# Patient Record
Sex: Male | Born: 1960
Health system: Southern US, Community
[De-identification: ages and names within clinical notes are randomized; demographics above are authoritative.]

## PROBLEM LIST (undated history)

## (undated) DIAGNOSIS — I10 Essential (primary) hypertension: Secondary | ICD-10-CM

## (undated) DIAGNOSIS — B159 Hepatitis A without hepatic coma: Secondary | ICD-10-CM

## (undated) DIAGNOSIS — T7840XA Allergy, unspecified, initial encounter: Secondary | ICD-10-CM

## (undated) DIAGNOSIS — E785 Hyperlipidemia, unspecified: Secondary | ICD-10-CM

## (undated) HISTORY — DX: Essential (primary) hypertension: I10

## (undated) HISTORY — DX: Allergy, unspecified, initial encounter: T78.40XA

## (undated) HISTORY — DX: Hepatitis a without hepatic coma: B15.9

## (undated) HISTORY — DX: Hyperlipidemia, unspecified: E78.5

---

## 2005-08-01 ENCOUNTER — Emergency Department (HOSPITAL_COMMUNITY): Admission: EM | Admit: 2005-08-01 | Discharge: 2005-08-02 | Payer: Self-pay | Admitting: Emergency Medicine

## 2011-06-05 ENCOUNTER — Encounter: Payer: Self-pay | Admitting: Gastroenterology

## 2011-06-19 ENCOUNTER — Ambulatory Visit (AMBULATORY_SURGERY_CENTER): Payer: BC Managed Care – PPO | Admitting: *Deleted

## 2011-06-19 ENCOUNTER — Encounter: Payer: Self-pay | Admitting: Gastroenterology

## 2011-06-19 VITALS — Ht 69.0 in | Wt 225.0 lb

## 2011-06-19 DIAGNOSIS — Z1211 Encounter for screening for malignant neoplasm of colon: Secondary | ICD-10-CM

## 2011-06-19 MED ORDER — PEG-KCL-NACL-NASULF-NA ASC-C 100 G PO SOLR: ORAL | Status: DC

## 2011-07-03 ENCOUNTER — Encounter: Payer: Self-pay | Admitting: Gastroenterology

## 2011-07-03 ENCOUNTER — Ambulatory Visit (AMBULATORY_SURGERY_CENTER): Payer: BC Managed Care – PPO | Admitting: Gastroenterology

## 2011-07-03 VITALS — BP 132/106 | HR 84 | Temp 98.2°F | Resp 16 | Ht 69.0 in | Wt 225.0 lb

## 2011-07-03 DIAGNOSIS — Z1211 Encounter for screening for malignant neoplasm of colon: Secondary | ICD-10-CM

## 2011-07-03 MED ORDER — SODIUM CHLORIDE 0.9 % IV SOLN: 500.0000 mL | INTRAVENOUS | Status: DC

## 2011-07-03 NOTE — Patient Instructions (Addendum)
YOU HAD AN ENDOSCOPIC PROCEDURE TODAY AT THE [facility]: Refer to the procedure report that was given to you for any specific questions about what was found during the examination.  If the procedure report does not answer your questions, please call your gastroenterologist to clarify.  If you requested that your care partner not be given the details of your procedure findings, then the procedure report has been included in a sealed envelope for you to review at your convenience later.  YOU SHOULD EXPECT: Some feelings of bloating in the abdomen. Passage of more gas than usual.  Walking can help get rid of the air that was put into your GI tract during the procedure and reduce the bloating. If you had a lower endoscopy (such as a colonoscopy or flexible sigmoidoscopy) you may notice spotting of blood in your stool or on the toilet paper. If you underwent a bowel prep for your procedure, then you may not have a normal bowel movement for a few days.  DIET: Your first meal following the procedure should be a light meal and then it is ok to progress to your normal diet.  A half-sandwich or bowl of soup is an example of a good first meal.  Heavy or fried foods are harder to digest and may make you feel nauseous or bloated.  Likewise meals heavy in dairy and vegetables can cause extra gas to form and this can also increase the bloating.  Drink plenty of fluids but you should avoid alcoholic beverages for 24 hours.  ACTIVITY: Your care partner should take you home directly after the procedure.  You should plan to take it easy, moving slowly for the rest of the day.  You can resume normal activity the day after the procedure however you should NOT DRIVE or use heavy machinery for 24 hours (because of the sedation medicines used during the test).    SYMPTOMS TO REPORT IMMEDIATELY: A gastroenterologist can be reached at any hour.  During normal business hours, 8:30 AM to 5:00 PM Monday through Friday,  call [phone].  After hours and on weekends, please call the GI answering service at [phone] who will take a message and have the physician on call contact you.   Following lower endoscopy (colonoscopy or flexible sigmoidoscopy):  Excessive amounts of blood in the stool  Significant tenderness or worsening of abdominal pains  Swelling of the abdomen that is new, acute  Fever of 100F or higher  FOLLOW UP:  Our staff will call the home number listed on your records the next business day following your procedure to check on you and address any questions or concerns that you may have at that time regarding the information given to you following your procedure. This is a courtesy call and so if there is no answer at the home number and we have not heard from you through the emergency physician on call, we will assume that you have returned to your regular daily activities without incident.  SIGNATURES/CONFIDENTIALITY: You and/or your care partner have signed paperwork which will be entered into your electronic medical record.  These signatures attest to the fact that that the information above on your After Visit Summary has been reviewed and is understood.  Full responsibility of the confidentiality of this discharge information lies with you and/or your care-partner.   Dr. Norval Gable office nurse will call you to set up an appointment for a sleep study

## 2011-07-03 NOTE — Progress Notes (Signed)
Patient did not experience any of the following events: a burn prior to discharge; a fall within the facility; wrong site/side/patient/procedure/implant event; or a hospital transfer or hospital admission upon discharge from the facility. (G8907) Patient did not have preoperative order for IV antibiotic SSI prophylaxis. (G8918)  

## 2011-07-03 NOTE — Op Note (Addendum)
[facility] [address] [place], [place]  [address]  COLONOSCOPY PROCEDURE REPORT  PATIENT:  Joel Powell, Joel Powell  MR#:  [id number] BIRTHDATE:  1960/02/06, 51 yrs. old  GENDER:  male ENDOSCOPIST:  Vania Rea. Jarold Motto, MD, [facility] REF. BY: PROCEDURE DATE:  07/03/2011 PROCEDURE:  Average-risk screening colonoscopy G0121 ASA CLASS:  Class II INDICATIONS:  Routine Risk Screening MEDICATIONS:   propofol (Diprivan) 200 mg IV  DESCRIPTION OF PROCEDURE:   After the risks and benefits and of the procedure were explained, informed consent was obtained. Digital rectal exam was performed and revealed no abnormalities. The LB [id number] endoscope was introduced through the anus and advanced to the cecum, which was identified by both the appendix and ileocecal valve.  The quality of the prep was excellent, using MoviPrep.  The instrument was then slowly withdrawn as the colon was fully examined. <<PROCEDUREIMAGES>>  FINDINGS:  No polyps or cancers were seen.  This was otherwise a normal examination of the colon.   Retroflexed views in the rectum revealed no abnormalities.    The scope was then withdrawn from the patient and the procedure completed.  COMPLICATIONS:  None ENDOSCOPIC IMPRESSION: 1) No polyps or cancers 2) Otherwise normal examination RECOMMENDATIONS: 1) Continue current colorectal screening recommendations for "routine risk" patients with a repeat colonoscopy in 10 years. REFERRAL FOR SLEEP APNEA EVALUATION PER UPPER AIRWAY OBSTRUCTION.  REPEAT EXAM:  No  ______________________________ Vania Rea. Jarold Motto, MD, [facility]  CC:  n. REVISED:  07/03/2011 03:06 PM eSIGNED:   Vania Rea. Molly Savarino at 07/03/2011 03:06 PM  Zetta Bills, [phone]

## 2011-07-04 ENCOUNTER — Telehealth: Payer: Self-pay | Admitting: *Deleted

## 2011-07-04 DIAGNOSIS — J988 Other specified respiratory disorders: Secondary | ICD-10-CM

## 2011-07-04 NOTE — Telephone Encounter (Signed)
lmom for pt to call back. Pt has an appt with Dr Craige Cotta on 08/09/2011 at 2:15pm, arrive at 2pm.

## 2011-07-04 NOTE — Telephone Encounter (Signed)
Informed pt of his appt with Dr Craige Cotta; pt stated understanding.

## 2011-07-04 NOTE — Telephone Encounter (Signed)
Message left for the patient at number provided in admitting. 

## 2011-08-09 ENCOUNTER — Institutional Professional Consult (permissible substitution): Payer: BC Managed Care – PPO | Admitting: Pulmonary Disease

## 2015-04-22 DIAGNOSIS — I10 Essential (primary) hypertension: Secondary | ICD-10-CM | POA: Diagnosis not present

## 2015-04-22 DIAGNOSIS — E789 Disorder of lipoprotein metabolism, unspecified: Secondary | ICD-10-CM | POA: Diagnosis not present

## 2015-07-29 DIAGNOSIS — I10 Essential (primary) hypertension: Secondary | ICD-10-CM | POA: Diagnosis not present

## 2015-07-29 DIAGNOSIS — E785 Hyperlipidemia, unspecified: Secondary | ICD-10-CM | POA: Diagnosis not present

## 2015-07-29 DIAGNOSIS — E789 Disorder of lipoprotein metabolism, unspecified: Secondary | ICD-10-CM | POA: Diagnosis not present

## 2015-10-26 ENCOUNTER — Other Ambulatory Visit: Payer: Self-pay

## 2015-10-26 MED ORDER — SIMVASTATIN 20 MG PO TABS: 20.0000 mg | ORAL_TABLET | Freq: Every day | ORAL | 0 refills | Status: DC

## 2015-10-26 MED ORDER — FENOFIBRATE MICRONIZED 134 MG PO CAPS: 134.0000 mg | ORAL_CAPSULE | Freq: Every day | ORAL | 0 refills | Status: DC

## 2015-11-01 ENCOUNTER — Other Ambulatory Visit: Payer: Self-pay

## 2015-11-01 MED ORDER — SIMVASTATIN 20 MG PO TABS: 20.0000 mg | ORAL_TABLET | Freq: Every day | ORAL | 0 refills | Status: DC

## 2015-11-01 MED ORDER — FENOFIBRATE MICRONIZED 134 MG PO CAPS: 134.0000 mg | ORAL_CAPSULE | Freq: Every day | ORAL | 0 refills | Status: DC

## 2016-06-01 ENCOUNTER — Encounter: Payer: Self-pay | Admitting: Physician Assistant

## 2016-06-01 ENCOUNTER — Telehealth: Payer: Self-pay

## 2016-06-01 ENCOUNTER — Ambulatory Visit (INDEPENDENT_AMBULATORY_CARE_PROVIDER_SITE_OTHER): Payer: BLUE CROSS/BLUE SHIELD | Admitting: Physician Assistant

## 2016-06-01 VITALS — BP 153/89 | HR 65 | Temp 98.6°F | Ht 69.0 in | Wt 255.0 lb

## 2016-06-01 DIAGNOSIS — E782 Mixed hyperlipidemia: Secondary | ICD-10-CM

## 2016-06-01 DIAGNOSIS — I10 Essential (primary) hypertension: Secondary | ICD-10-CM | POA: Insufficient documentation

## 2016-06-01 DIAGNOSIS — Z0000 Encounter for general adult medical examination without abnormal findings: Secondary | ICD-10-CM | POA: Diagnosis not present

## 2016-06-01 DIAGNOSIS — Z125 Encounter for screening for malignant neoplasm of prostate: Secondary | ICD-10-CM

## 2016-06-01 DIAGNOSIS — J301 Allergic rhinitis due to pollen: Secondary | ICD-10-CM | POA: Insufficient documentation

## 2016-06-01 MED ORDER — FENOFIBRATE MICRONIZED 134 MG PO CAPS: 134.0000 mg | ORAL_CAPSULE | Freq: Every day | ORAL | 3 refills | Status: DC

## 2016-06-01 MED ORDER — VERAPAMIL HCL ER 240 MG PO TBCR: 240.0000 mg | EXTENDED_RELEASE_TABLET | Freq: Every day | ORAL | 0 refills | Status: DC

## 2016-06-01 MED ORDER — LISINOPRIL 40 MG PO TABS: 40.0000 mg | ORAL_TABLET | Freq: Every day | ORAL | 3 refills | Status: DC

## 2016-06-01 MED ORDER — SIMVASTATIN 20 MG PO TABS: 20.0000 mg | ORAL_TABLET | Freq: Every day | ORAL | 3 refills | Status: DC

## 2016-06-01 MED ORDER — HYDROCHLOROTHIAZIDE 25 MG PO TABS: 25.0000 mg | ORAL_TABLET | Freq: Every day | ORAL | 3 refills | Status: DC

## 2016-06-01 MED ORDER — METOPROLOL TARTRATE 50 MG PO TABS: 50.0000 mg | ORAL_TABLET | Freq: Two times a day (BID) | ORAL | 3 refills | Status: DC

## 2016-06-01 MED ORDER — TRIAMCINOLONE ACETONIDE 0.5 % EX CREA: 1.0000 "application " | TOPICAL_CREAM | Freq: Three times a day (TID) | CUTANEOUS | 0 refills | Status: DC

## 2016-06-01 MED ORDER — CLOBETASOL PROPIONATE 0.05 % EX CREA: 1.0000 "application " | TOPICAL_CREAM | Freq: Two times a day (BID) | CUTANEOUS | 0 refills | Status: DC

## 2016-06-01 NOTE — Patient Instructions (Signed)
dash DASH Eating Plan DASH stands for "Dietary Approaches to Stop Hypertension." The DASH eating plan is a healthy eating plan that has been shown to reduce high blood pressure (hypertension). It may also reduce your risk for type 2 diabetes, heart disease, and stroke. The DASH eating plan may also help with weight loss. What are tips for following this plan? General guidelines   Avoid eating more than 2,300 mg (milligrams) of salt (sodium) a day. If you have hypertension, you may need to reduce your sodium intake to 1,500 mg a day.  Limit alcohol intake to no more than 1 drink a day for nonpregnant women and 2 drinks a day for men. One drink equals 12 oz of beer, 5 oz of wine, or 1 oz of hard liquor.  Work with your health care provider to maintain a healthy body weight or to lose weight. Ask what an ideal weight is for you.  Get at least 30 minutes of exercise that causes your heart to beat faster (aerobic exercise) most days of the week. Activities may include walking, swimming, or biking.  Work with your health care provider or diet and nutrition specialist (dietitian) to adjust your eating plan to your individual calorie needs. Reading food labels   Check food labels for the amount of sodium per serving. Choose foods with less than 5 percent of the Daily Value of sodium. Generally, foods with less than 300 mg of sodium per serving fit into this eating plan.  To find whole grains, look for the word "whole" as the first word in the ingredient list. Shopping   Buy products labeled as "low-sodium" or "no salt added."  Buy fresh foods. Avoid canned foods and premade or frozen meals. Cooking   Avoid adding salt when cooking. Use salt-free seasonings or herbs instead of table salt or sea salt. Check with your health care provider or pharmacist before using salt substitutes.  Do not fry foods. Cook foods using healthy methods such as baking, boiling, grilling, and broiling instead.  Cook  with heart-healthy oils, such as olive, canola, soybean, or sunflower oil. Meal planning    Eat a balanced diet that includes:  5 or more servings of fruits and vegetables each day. At each meal, try to fill half of your plate with fruits and vegetables.  Up to 6-8 servings of whole grains each day.  Less than 6 oz of lean meat, poultry, or fish each day. A 3-oz serving of meat is about the same size as a deck of cards. One egg equals 1 oz.  2 servings of low-fat dairy each day.  A serving of nuts, seeds, or beans 5 times each week.  Heart-healthy fats. Healthy fats called Omega-3 fatty acids are found in foods such as flaxseeds and coldwater fish, like sardines, salmon, and mackerel.  Limit how much you eat of the following:  Canned or prepackaged foods.  Food that is high in trans fat, such as fried foods.  Food that is high in saturated fat, such as fatty meat.  Sweets, desserts, sugary drinks, and other foods with added sugar.  Full-fat dairy products.  Do not salt foods before eating.  Try to eat at least 2 vegetarian meals each week.  Eat more home-cooked food and less restaurant, buffet, and fast food.  When eating at a restaurant, ask that your food be prepared with less salt or no salt, if possible. What foods are recommended? The items listed may not be a complete list.   Talk with your dietitian about what dietary choices are best for you. Grains  Whole-grain or whole-wheat bread. Whole-grain or whole-wheat pasta. Brown rice. Oatmeal. Quinoa. Bulgur. Whole-grain and low-sodium cereals. Pita bread. Low-fat, low-sodium crackers. Whole-wheat flour tortillas. Vegetables  Fresh or frozen vegetables (raw, steamed, roasted, or grilled). Low-sodium or reduced-sodium tomato and vegetable juice. Low-sodium or reduced-sodium tomato sauce and tomato paste. Low-sodium or reduced-sodium canned vegetables. Fruits  All fresh, dried, or frozen fruit. Canned fruit in natural juice  (without added sugar). Meat and other protein foods  Skinless chicken or turkey. Ground chicken or turkey. Pork with fat trimmed off. Fish and seafood. Egg whites. Dried beans, peas, or lentils. Unsalted nuts, nut butters, and seeds. Unsalted canned beans. Lean cuts of beef with fat trimmed off. Low-sodium, lean deli meat. Dairy  Low-fat (1%) or fat-free (skim) milk. Fat-free, low-fat, or reduced-fat cheeses. Nonfat, low-sodium ricotta or cottage cheese. Low-fat or nonfat yogurt. Low-fat, low-sodium cheese. Fats and oils  Soft margarine without trans fats. Vegetable oil. Low-fat, reduced-fat, or light mayonnaise and salad dressings (reduced-sodium). Canola, safflower, olive, soybean, and sunflower oils. Avocado. Seasoning and other foods  Herbs. Spices. Seasoning mixes without salt. Unsalted popcorn and pretzels. Fat-free sweets. What foods are not recommended? The items listed may not be a complete list. Talk with your dietitian about what dietary choices are best for you. Grains  Baked goods made with fat, such as croissants, muffins, or some breads. Dry pasta or rice meal packs. Vegetables  Creamed or fried vegetables. Vegetables in a cheese sauce. Regular canned vegetables (not low-sodium or reduced-sodium). Regular canned tomato sauce and paste (not low-sodium or reduced-sodium). Regular tomato and vegetable juice (not low-sodium or reduced-sodium). Pickles. Olives. Fruits  Canned fruit in a light or heavy syrup. Fried fruit. Fruit in cream or butter sauce. Meat and other protein foods  Fatty cuts of meat. Ribs. Fried meat. Bacon. Sausage. Bologna and other processed lunch meats. Salami. Fatback. Hotdogs. Bratwurst. Salted nuts and seeds. Canned beans with added salt. Canned or smoked fish. Whole eggs or egg yolks. Chicken or turkey with skin. Dairy  Whole or 2% milk, cream, and half-and-half. Whole or full-fat cream cheese. Whole-fat or sweetened yogurt. Full-fat cheese. Nondairy creamers.  Whipped toppings. Processed cheese and cheese spreads. Fats and oils  Butter. Stick margarine. Lard. Shortening. Ghee. Bacon fat. Tropical oils, such as coconut, palm kernel, or palm oil. Seasoning and other foods  Salted popcorn and pretzels. Onion salt, garlic salt, seasoned salt, table salt, and sea salt. Worcestershire sauce. Tartar sauce. Barbecue sauce. Teriyaki sauce. Soy sauce, including reduced-sodium. Steak sauce. Canned and packaged gravies. Fish sauce. Oyster sauce. Cocktail sauce. Horseradish that you find on the shelf. Ketchup. Mustard. Meat flavorings and tenderizers. Bouillon cubes. Hot sauce and Tabasco sauce. Premade or packaged marinades. Premade or packaged taco seasonings. Relishes. Regular salad dressings. Where to find more information:  National Heart, Lung, and Blood Institute: www.nhlbi.nih.gov  American Heart Association: www.heart.org Summary  The DASH eating plan is a healthy eating plan that has been shown to reduce high blood pressure (hypertension). It may also reduce your risk for type 2 diabetes, heart disease, and stroke.  With the DASH eating plan, you should limit salt (sodium) intake to 2,300 mg a day. If you have hypertension, you may need to reduce your sodium intake to 1,500 mg a day.  When on the DASH eating plan, aim to eat more fresh fruits and vegetables, whole grains, lean proteins, low-fat dairy, and heart-healthy fats.    Work with your health care provider or diet and nutrition specialist (dietitian) to adjust your eating plan to your individual calorie needs. This information is not intended to replace advice given to you by your health care provider. Make sure you discuss any questions you have with your health care provider. Document Released: 12/22/2010 Document Revised: 12/27/2015 Document Reviewed: 12/27/2015 Elsevier Interactive Patient Education  2017 Elsevier Inc.  

## 2016-06-02 LAB — LIPID PANEL
Chol/HDL Ratio: 3.3 ratio (ref 0.0–5.0)
Cholesterol, Total: 140 mg/dL (ref 100–199)
HDL: 42 mg/dL (ref >39)
LDL Calculated: 83 mg/dL (ref 0–99)
Triglycerides: 76 mg/dL (ref 0–149)
VLDL Cholesterol Cal: 15 mg/dL (ref 5–40)

## 2016-06-02 LAB — CMP14+EGFR
ALT: 11 IU/L (ref 0–44)
AST: 20 IU/L (ref 0–40)
Albumin/Globulin Ratio: 1.4 (ref 1.2–2.2)
Albumin: 4.1 g/dL (ref 3.5–5.5)
Alkaline Phosphatase: 56 IU/L (ref 39–117)
BUN/Creatinine Ratio: 12 (ref 9–20)
BUN: 9 mg/dL (ref 6–24)
Bilirubin Total: 0.5 mg/dL (ref 0.0–1.2)
CO2: 26 mmol/L (ref 18–29)
Calcium: 8.8 mg/dL (ref 8.7–10.2)
Chloride: 94 mmol/L — ABNORMAL LOW (ref 96–106)
Creatinine, Ser: 0.74 mg/dL — ABNORMAL LOW (ref 0.76–1.27)
GFR calc Af Amer: 118 mL/min/{1.73_m2} (ref >59)
GFR calc non Af Amer: 102 mL/min/{1.73_m2} (ref >59)
Globulin, Total: 3 g/dL (ref 1.5–4.5)
Glucose: 82 mg/dL (ref 65–99)
Potassium: 3.5 mmol/L (ref 3.5–5.2)
Sodium: 138 mmol/L (ref 134–144)
Total Protein: 7.1 g/dL (ref 6.0–8.5)

## 2016-06-02 LAB — CBC WITH DIFFERENTIAL/PLATELET
Basophils Absolute: 0.1 10*3/uL (ref 0.0–0.2)
Basos: 1 %
EOS (ABSOLUTE): 0.5 10*3/uL — ABNORMAL HIGH (ref 0.0–0.4)
Eos: 6 %
Hematocrit: 43.7 % (ref 37.5–51.0)
Hemoglobin: 15 g/dL (ref 13.0–17.7)
Immature Grans (Abs): 0.1 10*3/uL (ref 0.0–0.1)
Immature Granulocytes: 1 %
Lymphocytes Absolute: 1.8 10*3/uL (ref 0.7–3.1)
Lymphs: 22 %
MCH: 31.9 pg (ref 26.6–33.0)
MCHC: 34.3 g/dL (ref 31.5–35.7)
MCV: 93 fL (ref 79–97)
Monocytes Absolute: 0.7 10*3/uL (ref 0.1–0.9)
Monocytes: 9 %
Neutrophils Absolute: 5.1 10*3/uL (ref 1.4–7.0)
Neutrophils: 61 %
Platelets: 391 10*3/uL — ABNORMAL HIGH (ref 150–379)
RBC: 4.7 x10E6/uL (ref 4.14–5.80)
RDW: 13.1 % (ref 12.3–15.4)
WBC: 8.3 10*3/uL (ref 3.4–10.8)

## 2016-06-02 LAB — PSA: Prostate Specific Ag, Serum: 2 ng/mL (ref 0.0–4.0)

## 2016-06-02 NOTE — Telephone Encounter (Signed)
x

## 2016-06-02 NOTE — Progress Notes (Signed)
BP (!) 153/89   Pulse 65   Temp 98.6 F (37 C) (Oral)   Ht 5' 9" (1.753 m)   Wt 255 lb (115.7 kg)   BMI 37.66 kg/m    Subjective:    Patient ID: Joel Odriscoll., male    DOB: 1960/06/14, 56 y.o.   MRN: [id number]  Joel Kadlec. is a 56 y.o. male presenting on 06/01/2016 for Annual Exam  HPI This patient comes in for annual well physical examination. All medications are reviewed today. There are no reports of any problems with the medications. All of the medical conditions are reviewed and updated.  Lab work is reviewed and will be ordered as medically necessary. There are no new problems reported with today's visit.  Patient reports doing well overall.   Past Medical History:  Diagnosis Date  . Allergy   . Hepatitis A as child  . Hyperlipidemia   . Hypertension    Relevant past medical, surgical, family and social history reviewed and updated as indicated. Interim medical history since our last visit reviewed. Allergies and medications reviewed and updated.   Data reviewed from any sources in EPIC.  Review of Systems  Constitutional: Negative.  Negative for appetite change and fatigue.  HENT: Negative.   Eyes: Negative.  Negative for pain and visual disturbance.  Respiratory: Negative.  Negative for cough, chest tightness, shortness of breath and wheezing.   Cardiovascular: Negative.  Negative for chest pain, palpitations and leg swelling.  Gastrointestinal: Negative.  Negative for abdominal pain, diarrhea, nausea and vomiting.  Endocrine: Negative.   Genitourinary: Negative.   Musculoskeletal: Negative.   Skin: Negative.  Negative for color change and rash.  Neurological: Negative.  Negative for weakness, numbness and headaches.  Psychiatric/Behavioral: Negative.      Social History   Social History  . Marital status: Married    Spouse name: N/A  . Number of children: N/A  . Years of education: N/A   Occupational History  . Not on file.   Social History Main  Topics  . Smoking status: Never Smoker  . Smokeless tobacco: Never Used  . Alcohol use 0.5 oz/week    1 Standard drinks or equivalent per week     Comment: occasional  . Drug use: No  . Sexual activity: Not on file   Other Topics Concern  . Not on file   Social History Narrative  . No narrative on file    History reviewed. No pertinent surgical history.  Family History  Problem Relation Age of Onset  . Kidney disease Father   . Liver cancer Father   . Colon cancer Neg Hx     Allergies as of 06/01/2016   No Known Allergies     Medication List       Accurate as of 06/01/16 11:59 PM. Always use your most recent med list.          aspirin 81 MG tablet Take 81 mg by mouth daily.   cetirizine 10 MG tablet Commonly known as:  ZYRTEC Take 10 mg by mouth daily.   clobetasol cream 0.05 % Commonly known as:  TEMOVATE Apply 1 application topically 2 (two) times daily.   fenofibrate micronized 134 MG capsule Commonly known as:  LOFIBRA Take 1 capsule (134 mg total) by mouth daily before breakfast.   hydrochlorothiazide 25 MG tablet Commonly known as:  HYDRODIURIL Take 1 tablet (25 mg total) by mouth daily.   lisinopril 40 MG tablet Commonly known as:  PRINIVIL,ZESTRIL Take 1 tablet (40 mg total) by mouth daily.   metoprolol tartrate 50 MG tablet Commonly known as:  LOPRESSOR Take 1 tablet (50 mg total) by mouth 2 (two) times daily.   multivitamin tablet Take 1 tablet by mouth daily.   simvastatin 20 MG tablet Commonly known as:  ZOCOR Take 1 tablet (20 mg total) by mouth daily.   triamcinolone cream 0.5 % Commonly known as:  KENALOG Apply 1 application topically 3 (three) times daily.   verapamil 240 MG CR tablet Commonly known as:  CALAN-SR Take 1 tablet (240 mg total) by mouth at bedtime.          Objective:    BP (!) 153/89   Pulse 65   Temp 98.6 F (37 C) (Oral)   Ht 5' 9" (1.753 m)   Wt 255 lb (115.7 kg)   BMI 37.66 kg/m   No Known  Allergies Wt Readings from Last 3 Encounters:  06/01/16 255 lb (115.7 kg)  07/03/11 225 lb (102.1 kg)  06/19/11 225 lb (102.1 kg)    Physical Exam  Constitutional: He appears well-developed and well-nourished.  HENT:  Head: Normocephalic and atraumatic.  Eyes: Conjunctivae and EOM are normal. Pupils are equal, round, and reactive to light.  Neck: Normal range of motion. Neck supple.  Cardiovascular: Normal rate, regular rhythm and normal heart sounds.   Pulmonary/Chest: Effort normal and breath sounds normal.  Abdominal: Soft. Bowel sounds are normal.  Musculoskeletal: Normal range of motion.  Skin: Skin is warm and dry.    Results for orders placed or performed in visit on 06/01/16  CMP14+EGFR  Result Value Ref Range   Glucose 82 65 - 99 mg/dL   BUN 9 6 - 24 mg/dL   Creatinine, Ser 0.74 (L) 0.76 - 1.27 mg/dL   GFR calc non Af Amer 102 >59 mL/min/1.73   GFR calc Af Amer 118 >59 mL/min/1.73   BUN/Creatinine Ratio 12 9 - 20   Sodium 138 134 - 144 mmol/L   Potassium 3.5 3.5 - 5.2 mmol/L   Chloride 94 (L) 96 - 106 mmol/L   CO2 26 18 - 29 mmol/L   Calcium 8.8 8.7 - 10.2 mg/dL   Total Protein 7.1 6.0 - 8.5 g/dL   Albumin 4.1 3.5 - 5.5 g/dL   Globulin, Total 3.0 1.5 - 4.5 g/dL   Albumin/Globulin Ratio 1.4 1.2 - 2.2   Bilirubin Total 0.5 0.0 - 1.2 mg/dL   Alkaline Phosphatase 56 39 - 117 IU/L   AST 20 0 - 40 IU/L   ALT 11 0 - 44 IU/L  CBC with Differential/Platelet  Result Value Ref Range   WBC 8.3 3.4 - 10.8 x10E3/uL   RBC 4.70 4.14 - 5.80 x10E6/uL   Hemoglobin 15.0 13.0 - 17.7 g/dL   Hematocrit 43.7 37.5 - 51.0 %   MCV 93 79 - 97 fL   MCH 31.9 26.6 - 33.0 pg   MCHC 34.3 31.5 - 35.7 g/dL   RDW 13.1 12.3 - 15.4 %   Platelets 391 (H) 150 - 379 x10E3/uL   Neutrophils 61 Not Estab. %   Lymphs 22 Not Estab. %   Monocytes 9 Not Estab. %   Eos 6 Not Estab. %   Basos 1 Not Estab. %   Neutrophils Absolute 5.1 1.4 - 7.0 x10E3/uL   Lymphocytes Absolute 1.8 0.7 - 3.1 x10E3/uL     Monocytes Absolute 0.7 0.1 - 0.9 x10E3/uL   EOS (ABSOLUTE) 0.5 (H) 0.0 - 0.4  x10E3/uL   Basophils Absolute 0.1 0.0 - 0.2 x10E3/uL   Immature Granulocytes 1 Not Estab. %   Immature Grans (Abs) 0.1 0.0 - 0.1 x10E3/uL  Lipid panel  Result Value Ref Range   Cholesterol, Total 140 100 - 199 mg/dL   Triglycerides 76 0 - 149 mg/dL   HDL 42 >39 mg/dL   VLDL Cholesterol Cal 15 5 - 40 mg/dL   LDL Calculated 83 0 - 99 mg/dL   Chol/HDL Ratio 3.3 0.0 - 5.0 ratio  PSA  Result Value Ref Range   Prostate Specific Ag, Serum 2.0 0.0 - 4.0 ng/mL      Assessment & Plan:   1. Well adult exam  2. Essential hypertension - verapamil (CALAN-SR) 240 MG CR tablet; Take 1 tablet (240 mg total) by mouth at bedtime.  Dispense: 90 tablet; Refill: 0 - lisinopril (PRINIVIL,ZESTRIL) 40 MG tablet; Take 1 tablet (40 mg total) by mouth daily.  Dispense: 90 tablet; Refill: 3 - hydrochlorothiazide (HYDRODIURIL) 25 MG tablet; Take 1 tablet (25 mg total) by mouth daily.  Dispense: 90 tablet; Refill: 3 - metoprolol tartrate (LOPRESSOR) 50 MG tablet; Take 1 tablet (50 mg total) by mouth 2 (two) times daily.  Dispense: 180 tablet; Refill: 3 - CMP14+EGFR - CBC with Differential/Platelet  3. Mixed hyperlipidemia - fenofibrate micronized (LOFIBRA) 134 MG capsule; Take 1 capsule (134 mg total) by mouth daily before breakfast.  Dispense: 90 capsule; Refill: 3 - simvastatin (ZOCOR) 20 MG tablet; Take 1 tablet (20 mg total) by mouth daily.  Dispense: 90 tablet; Refill: 3 - CMP14+EGFR - CBC with Differential/Platelet - Lipid panel  4. Allergic rhinitis due to pollen, unspecified seasonality  5. Screening for prostate cancer - PSA   Current Outpatient Prescriptions:  .  aspirin 81 MG tablet, Take 81 mg by mouth daily., Disp: , Rfl:  .  cetirizine (ZYRTEC) 10 MG tablet, Take 10 mg by mouth daily., Disp: , Rfl:  .  fenofibrate micronized (LOFIBRA) 134 MG capsule, Take 1 capsule (134 mg total) by mouth daily before  breakfast., Disp: 90 capsule, Rfl: 3 .  hydrochlorothiazide (HYDRODIURIL) 25 MG tablet, Take 1 tablet (25 mg total) by mouth daily., Disp: 90 tablet, Rfl: 3 .  metoprolol tartrate (LOPRESSOR) 50 MG tablet, Take 1 tablet (50 mg total) by mouth 2 (two) times daily., Disp: 180 tablet, Rfl: 3 .  Multiple Vitamin (MULTIVITAMIN) tablet, Take 1 tablet by mouth daily., Disp: , Rfl:  .  simvastatin (ZOCOR) 20 MG tablet, Take 1 tablet (20 mg total) by mouth daily., Disp: 90 tablet, Rfl: 3 .  clobetasol cream (TEMOVATE) 3.15 %, Apply 1 application topically 2 (two) times daily., Disp: 30 g, Rfl: 0 .  lisinopril (PRINIVIL,ZESTRIL) 40 MG tablet, Take 1 tablet (40 mg total) by mouth daily., Disp: 90 tablet, Rfl: 3 .  triamcinolone cream (KENALOG) 0.5 %, Apply 1 application topically 3 (three) times daily., Disp: 30 g, Rfl: 0 .  verapamil (CALAN-SR) 240 MG CR tablet, Take 1 tablet (240 mg total) by mouth at bedtime., Disp: 90 tablet, Rfl: 0  Continue all other maintenance medications as listed above. Educational handout given for health maintenance  Follow up plan: Return in about 2 months (around 08/01/2016) for recheck BP.  Terald Sleeper PA-C [place] [address]  [place], [place] [address] [phone]   06/02/2016, 9:16 AM

## 2016-06-13 ENCOUNTER — Other Ambulatory Visit: Payer: Self-pay | Admitting: Physician Assistant

## 2016-06-13 ENCOUNTER — Telehealth: Payer: Self-pay

## 2016-06-13 NOTE — Telephone Encounter (Signed)
I stopped clobetasol and switched to triamcinolone on 06/01/16. No prior auth needed for this.

## 2016-08-01 ENCOUNTER — Encounter: Payer: Self-pay | Admitting: Physician Assistant

## 2016-08-01 ENCOUNTER — Ambulatory Visit (INDEPENDENT_AMBULATORY_CARE_PROVIDER_SITE_OTHER): Payer: BLUE CROSS/BLUE SHIELD | Admitting: Physician Assistant

## 2016-08-01 VITALS — BP 145/85 | HR 64 | Temp 99.3°F | Ht 69.0 in | Wt 251.0 lb

## 2016-08-01 DIAGNOSIS — E782 Mixed hyperlipidemia: Secondary | ICD-10-CM

## 2016-08-01 DIAGNOSIS — I10 Essential (primary) hypertension: Secondary | ICD-10-CM | POA: Diagnosis not present

## 2016-08-01 NOTE — Patient Instructions (Signed)
DASH Eating Plan DASH stands for "Dietary Approaches to Stop Hypertension." The DASH eating plan is a healthy eating plan that has been shown to reduce high blood pressure (hypertension). It may also reduce your risk for type 2 diabetes, heart disease, and stroke. The DASH eating plan may also help with weight loss. What are tips for following this plan? General guidelines  Avoid eating more than 2,300 mg (milligrams) of salt (sodium) a day. If you have hypertension, you may need to reduce your sodium intake to 1,500 mg a day.  Limit alcohol intake to no more than 1 drink a day for nonpregnant women and 2 drinks a day for men. One drink equals 12 oz of beer, 5 oz of wine, or 1 oz of hard liquor.  Work with your health care provider to maintain a healthy body weight or to lose weight. Ask what an ideal weight is for you.  Get at least 30 minutes of exercise that causes your heart to beat faster (aerobic exercise) most days of the week. Activities may include walking, swimming, or biking.  Work with your health care provider or diet and nutrition specialist (dietitian) to adjust your eating plan to your individual calorie needs. Reading food labels  Check food labels for the amount of sodium per serving. Choose foods with less than 5 percent of the Daily Value of sodium. Generally, foods with less than 300 mg of sodium per serving fit into this eating plan.  To find whole grains, look for the word "whole" as the first word in the ingredient list. Shopping  Buy products labeled as "low-sodium" or "no salt added."  Buy fresh foods. Avoid canned foods and premade or frozen meals. Cooking  Avoid adding salt when cooking. Use salt-free seasonings or herbs instead of table salt or sea salt. Check with your health care provider or pharmacist before using salt substitutes.  Do not fry foods. Cook foods using healthy methods such as baking, boiling, grilling, and broiling instead.  Cook with  heart-healthy oils, such as olive, canola, soybean, or sunflower oil. Meal planning   Eat a balanced diet that includes: ? 5 or more servings of fruits and vegetables each day. At each meal, try to fill half of your plate with fruits and vegetables. ? Up to 6-8 servings of whole grains each day. ? Less than 6 oz of lean meat, poultry, or fish each day. A 3-oz serving of meat is about the same size as a deck of cards. One egg equals 1 oz. ? 2 servings of low-fat dairy each day. ? A serving of nuts, seeds, or beans 5 times each week. ? Heart-healthy fats. Healthy fats called Omega-3 fatty acids are found in foods such as flaxseeds and coldwater fish, like sardines, salmon, and mackerel.  Limit how much you eat of the following: ? Canned or prepackaged foods. ? Food that is high in trans fat, such as fried foods. ? Food that is high in saturated fat, such as fatty meat. ? Sweets, desserts, sugary drinks, and other foods with added sugar. ? Full-fat dairy products.  Do not salt foods before eating.  Try to eat at least 2 vegetarian meals each week.  Eat more home-cooked food and less restaurant, buffet, and fast food.  When eating at a restaurant, ask that your food be prepared with less salt or no salt, if possible. What foods are recommended? The items listed may not be a complete list. Talk with your dietitian about what   dietary choices are best for you. Grains Whole-grain or whole-wheat bread. Whole-grain or whole-wheat pasta. Brown rice. Oatmeal. Quinoa. Bulgur. Whole-grain and low-sodium cereals. Pita bread. Low-fat, low-sodium crackers. Whole-wheat flour tortillas. Vegetables Fresh or frozen vegetables (raw, steamed, roasted, or grilled). Low-sodium or reduced-sodium tomato and vegetable juice. Low-sodium or reduced-sodium tomato sauce and tomato paste. Low-sodium or reduced-sodium canned vegetables. Fruits All fresh, dried, or frozen fruit. Canned fruit in natural juice (without  added sugar). Meat and other protein foods Skinless chicken or turkey. Ground chicken or turkey. Pork with fat trimmed off. Fish and seafood. Egg whites. Dried beans, peas, or lentils. Unsalted nuts, nut butters, and seeds. Unsalted canned beans. Lean cuts of beef with fat trimmed off. Low-sodium, lean deli meat. Dairy Low-fat (1%) or fat-free (skim) milk. Fat-free, low-fat, or reduced-fat cheeses. Nonfat, low-sodium ricotta or cottage cheese. Low-fat or nonfat yogurt. Low-fat, low-sodium cheese. Fats and oils Soft margarine without trans fats. Vegetable oil. Low-fat, reduced-fat, or light mayonnaise and salad dressings (reduced-sodium). Canola, safflower, olive, soybean, and sunflower oils. Avocado. Seasoning and other foods Herbs. Spices. Seasoning mixes without salt. Unsalted popcorn and pretzels. Fat-free sweets. What foods are not recommended? The items listed may not be a complete list. Talk with your dietitian about what dietary choices are best for you. Grains Baked goods made with fat, such as croissants, muffins, or some breads. Dry pasta or rice meal packs. Vegetables Creamed or fried vegetables. Vegetables in a cheese sauce. Regular canned vegetables (not low-sodium or reduced-sodium). Regular canned tomato sauce and paste (not low-sodium or reduced-sodium). Regular tomato and vegetable juice (not low-sodium or reduced-sodium). Pickles. Olives. Fruits Canned fruit in a light or heavy syrup. Fried fruit. Fruit in cream or butter sauce. Meat and other protein foods Fatty cuts of meat. Ribs. Fried meat. Bacon. Sausage. Bologna and other processed lunch meats. Salami. Fatback. Hotdogs. Bratwurst. Salted nuts and seeds. Canned beans with added salt. Canned or smoked fish. Whole eggs or egg yolks. Chicken or turkey with skin. Dairy Whole or 2% milk, cream, and half-and-half. Whole or full-fat cream cheese. Whole-fat or sweetened yogurt. Full-fat cheese. Nondairy creamers. Whipped toppings.  Processed cheese and cheese spreads. Fats and oils Butter. Stick margarine. Lard. Shortening. Ghee. Bacon fat. Tropical oils, such as coconut, palm kernel, or palm oil. Seasoning and other foods Salted popcorn and pretzels. Onion salt, garlic salt, seasoned salt, table salt, and sea salt. Worcestershire sauce. Tartar sauce. Barbecue sauce. Teriyaki sauce. Soy sauce, including reduced-sodium. Steak sauce. Canned and packaged gravies. Fish sauce. Oyster sauce. Cocktail sauce. Horseradish that you find on the shelf. Ketchup. Mustard. Meat flavorings and tenderizers. Bouillon cubes. Hot sauce and Tabasco sauce. Premade or packaged marinades. Premade or packaged taco seasonings. Relishes. Regular salad dressings. Where to find more information:  National Heart, Lung, and Blood Institute: www.nhlbi.nih.gov  American Heart Association: www.heart.org Summary  The DASH eating plan is a healthy eating plan that has been shown to reduce high blood pressure (hypertension). It may also reduce your risk for type 2 diabetes, heart disease, and stroke.  With the DASH eating plan, you should limit salt (sodium) intake to 2,300 mg a day. If you have hypertension, you may need to reduce your sodium intake to 1,500 mg a day.  When on the DASH eating plan, aim to eat more fresh fruits and vegetables, whole grains, lean proteins, low-fat dairy, and heart-healthy fats.  Work with your health care provider or diet and nutrition specialist (dietitian) to adjust your eating plan to your individual   calorie needs. This information is not intended to replace advice given to you by your health care provider. Make sure you discuss any questions you have with your health care provider. Document Released: 12/22/2010 Document Revised: 12/27/2015 Document Reviewed: 12/27/2015 Elsevier Interactive Patient Education  2017 Elsevier Inc.  

## 2016-08-01 NOTE — Progress Notes (Signed)
BP (!) 145/85   Pulse 64   Temp 99.3 F (37.4 C) (Oral)   Ht '5\' 9"'  (1.753 m)   Wt 251 lb (113.9 kg)   BMI 37.07 kg/m    Subjective:    Patient ID: Joel Nurse., male    DOB: 10-07-1960, 56 y.o.   MRN: [id number]  HPI: Joel Rosemond. is a 57 y.o. male presenting on 08/01/2016 for Follow-up (2 month follow up HTN)  This patient comes in for periodic recheck on medications and conditions including hypertension. His readings are improved from before. He is normally seen anywhere from 086-761 at home systolically. He has no complains of chest pain nor any difficulties with his medication. He is down 4 pounds since his last visit he is really trying to work on walking and being more active at work. I commended him on this and hope he'll continue over the next 3 months we'll plan to recheck him then..   All medications are reviewed today. There are no reports of any problems with the medications. All of the medical conditions are reviewed and updated.  Lab work is reviewed and will be ordered as medically necessary. There are no new problems reported with today's visit.  Relevant past medical, surgical, family and social history reviewed and updated as indicated. Allergies and medications reviewed and updated.  Past Medical History:  Diagnosis Date  . Allergy   . Hepatitis A as child  . Hyperlipidemia   . Hypertension     History reviewed. No pertinent surgical history.  Review of Systems  Constitutional: Negative.  Negative for appetite change and fatigue.  HENT: Negative.   Eyes: Negative.  Negative for pain and visual disturbance.  Respiratory: Negative.  Negative for cough, chest tightness, shortness of breath and wheezing.   Cardiovascular: Negative.  Negative for chest pain, palpitations and leg swelling.  Gastrointestinal: Negative.  Negative for abdominal pain, diarrhea, nausea and vomiting.  Endocrine: Negative.   Genitourinary: Negative.   Musculoskeletal: Negative.     Skin: Negative.  Negative for color change and rash.  Neurological: Negative.  Negative for weakness, numbness and headaches.  Psychiatric/Behavioral: Negative.     Allergies as of 08/01/2016   No Known Allergies     Medication List       Accurate as of 08/01/16  9:39 AM. Always use your most recent med list.          aspirin 81 MG tablet Take 81 mg by mouth daily.   cetirizine 10 MG tablet Commonly known as:  ZYRTEC Take 10 mg by mouth daily.   fenofibrate micronized 134 MG capsule Commonly known as:  LOFIBRA Take 1 capsule (134 mg total) by mouth daily before breakfast.   hydrochlorothiazide 25 MG tablet Commonly known as:  HYDRODIURIL Take 1 tablet (25 mg total) by mouth daily.   lisinopril 40 MG tablet Commonly known as:  PRINIVIL,ZESTRIL Take 1 tablet (40 mg total) by mouth daily.   metoprolol tartrate 50 MG tablet Commonly known as:  LOPRESSOR Take 1 tablet (50 mg total) by mouth 2 (two) times daily.   multivitamin tablet Take 1 tablet by mouth daily.   simvastatin 20 MG tablet Commonly known as:  ZOCOR Take 1 tablet (20 mg total) by mouth daily.   triamcinolone cream 0.5 % Commonly known as:  KENALOG Apply 1 application topically 3 (three) times daily.   verapamil 240 MG CR tablet Commonly known as:  CALAN-SR Take 1 tablet (240 mg total) by  mouth at bedtime.          Objective:    BP (!) 145/85   Pulse 64   Temp 99.3 F (37.4 C) (Oral)   Ht '5\' 9"'  (1.753 m)   Wt 251 lb (113.9 kg)   BMI 37.07 kg/m   No Known Allergies  Physical Exam  Constitutional: He appears well-developed and well-nourished.  HENT:  Head: Normocephalic and atraumatic.  Eyes: Pupils are equal, round, and reactive to light. Conjunctivae and EOM are normal.  Neck: Normal range of motion. Neck supple.  Cardiovascular: Normal rate, regular rhythm and normal heart sounds.   Pulmonary/Chest: Effort normal and breath sounds normal.  Abdominal: Soft. Bowel sounds are  normal.  Musculoskeletal: Normal range of motion.  Skin: Skin is warm and dry.    Results for orders placed or performed in visit on 06/01/16  CMP14+EGFR  Result Value Ref Range   Glucose 82 65 - 99 mg/dL   BUN 9 6 - 24 mg/dL   Creatinine, Ser 0.74 (L) 0.76 - 1.27 mg/dL   GFR calc non Af Amer 102 >59 mL/min/1.73   GFR calc Af Amer 118 >59 mL/min/1.73   BUN/Creatinine Ratio 12 9 - 20   Sodium 138 134 - 144 mmol/L   Potassium 3.5 3.5 - 5.2 mmol/L   Chloride 94 (L) 96 - 106 mmol/L   CO2 26 18 - 29 mmol/L   Calcium 8.8 8.7 - 10.2 mg/dL   Total Protein 7.1 6.0 - 8.5 g/dL   Albumin 4.1 3.5 - 5.5 g/dL   Globulin, Total 3.0 1.5 - 4.5 g/dL   Albumin/Globulin Ratio 1.4 1.2 - 2.2   Bilirubin Total 0.5 0.0 - 1.2 mg/dL   Alkaline Phosphatase 56 39 - 117 IU/L   AST 20 0 - 40 IU/L   ALT 11 0 - 44 IU/L  CBC with Differential/Platelet  Result Value Ref Range   WBC 8.3 3.4 - 10.8 x10E3/uL   RBC 4.70 4.14 - 5.80 x10E6/uL   Hemoglobin 15.0 13.0 - 17.7 g/dL   Hematocrit 43.7 37.5 - 51.0 %   MCV 93 79 - 97 fL   MCH 31.9 26.6 - 33.0 pg   MCHC 34.3 31.5 - 35.7 g/dL   RDW 13.1 12.3 - 15.4 %   Platelets 391 (H) 150 - 379 x10E3/uL   Neutrophils 61 Not Estab. %   Lymphs 22 Not Estab. %   Monocytes 9 Not Estab. %   Eos 6 Not Estab. %   Basos 1 Not Estab. %   Neutrophils Absolute 5.1 1.4 - 7.0 x10E3/uL   Lymphocytes Absolute 1.8 0.7 - 3.1 x10E3/uL   Monocytes Absolute 0.7 0.1 - 0.9 x10E3/uL   EOS (ABSOLUTE) 0.5 (H) 0.0 - 0.4 x10E3/uL   Basophils Absolute 0.1 0.0 - 0.2 x10E3/uL   Immature Granulocytes 1 Not Estab. %   Immature Grans (Abs) 0.1 0.0 - 0.1 x10E3/uL  Lipid panel  Result Value Ref Range   Cholesterol, Total 140 100 - 199 mg/dL   Triglycerides 76 0 - 149 mg/dL   HDL 42 >39 mg/dL   VLDL Cholesterol Cal 15 5 - 40 mg/dL   LDL Calculated 83 0 - 99 mg/dL   Chol/HDL Ratio 3.3 0.0 - 5.0 ratio  PSA  Result Value Ref Range   Prostate Specific Ag, Serum 2.0 0.0 - 4.0 ng/mL        Assessment & Plan:   1. Essential hypertension  2. Mixed hyperlipidemia   Current Outpatient Prescriptions:  .  aspirin 81 MG tablet, Take 81 mg by mouth daily., Disp: , Rfl:  .  cetirizine (ZYRTEC) 10 MG tablet, Take 10 mg by mouth daily., Disp: , Rfl:  .  fenofibrate micronized (LOFIBRA) 134 MG capsule, Take 1 capsule (134 mg total) by mouth daily before breakfast., Disp: 90 capsule, Rfl: 3 .  hydrochlorothiazide (HYDRODIURIL) 25 MG tablet, Take 1 tablet (25 mg total) by mouth daily., Disp: 90 tablet, Rfl: 3 .  lisinopril (PRINIVIL,ZESTRIL) 40 MG tablet, Take 1 tablet (40 mg total) by mouth daily., Disp: 90 tablet, Rfl: 3 .  metoprolol tartrate (LOPRESSOR) 50 MG tablet, Take 1 tablet (50 mg total) by mouth 2 (two) times daily., Disp: 180 tablet, Rfl: 3 .  Multiple Vitamin (MULTIVITAMIN) tablet, Take 1 tablet by mouth daily., Disp: , Rfl:  .  simvastatin (ZOCOR) 20 MG tablet, Take 1 tablet (20 mg total) by mouth daily., Disp: 90 tablet, Rfl: 3 .  triamcinolone cream (KENALOG) 0.5 %, Apply 1 application topically 3 (three) times daily., Disp: 30 g, Rfl: 0 .  verapamil (CALAN-SR) 240 MG CR tablet, Take 1 tablet (240 mg total) by mouth at bedtime., Disp: 90 tablet, Rfl: 0  Continue all other maintenance medications as listed above.  Follow up plan: Return in about 3 months (around 11/01/2016).  Educational handout given for dash diet  Terald Sleeper PA-C [facility] [address]  [place],  [address] [phone]   08/01/2016, 9:40 AM

## 2016-08-27 ENCOUNTER — Other Ambulatory Visit: Payer: Self-pay | Admitting: Physician Assistant

## 2016-08-27 DIAGNOSIS — I10 Essential (primary) hypertension: Secondary | ICD-10-CM

## 2016-08-30 ENCOUNTER — Other Ambulatory Visit: Payer: Self-pay | Admitting: Physician Assistant

## 2016-08-30 DIAGNOSIS — I10 Essential (primary) hypertension: Secondary | ICD-10-CM

## 2016-08-31 ENCOUNTER — Telehealth: Payer: Self-pay | Admitting: Physician Assistant

## 2016-08-31 NOTE — Telephone Encounter (Signed)
Patient aware rx sent to pharmacy.  

## 2016-08-31 NOTE — Telephone Encounter (Signed)
What is the name of the medication? veramapril  Have you contacted your pharmacy to request a refill? yes  Which pharmacy would you like this sent to? [facility] in [place]. He is going out of town tomorrow and needs this.    Patient notified that their request is being sent to the clinical staff for review and that they should receive a call once it is complete. If they do not receive a call within 24 hours they can check with their pharmacy or our office.

## 2016-11-03 ENCOUNTER — Ambulatory Visit: Payer: BLUE CROSS/BLUE SHIELD | Admitting: Physician Assistant

## 2016-11-15 ENCOUNTER — Ambulatory Visit (INDEPENDENT_AMBULATORY_CARE_PROVIDER_SITE_OTHER): Payer: BLUE CROSS/BLUE SHIELD | Admitting: Physician Assistant

## 2016-11-15 ENCOUNTER — Encounter: Payer: Self-pay | Admitting: Physician Assistant

## 2016-11-15 DIAGNOSIS — Z23 Encounter for immunization: Secondary | ICD-10-CM | POA: Diagnosis not present

## 2016-11-15 DIAGNOSIS — E782 Mixed hyperlipidemia: Secondary | ICD-10-CM

## 2016-11-15 DIAGNOSIS — I10 Essential (primary) hypertension: Secondary | ICD-10-CM

## 2016-11-15 MED ORDER — SIMVASTATIN 20 MG PO TABS: 20.0000 mg | ORAL_TABLET | Freq: Every day | ORAL | 3 refills | Status: DC

## 2016-11-15 MED ORDER — CLONIDINE 0.1 MG/24HR TD PTWK: 0.1000 mg | MEDICATED_PATCH | TRANSDERMAL | 12 refills | Status: DC

## 2016-11-15 MED ORDER — METOPROLOL TARTRATE 50 MG PO TABS: 50.0000 mg | ORAL_TABLET | Freq: Two times a day (BID) | ORAL | 3 refills | Status: DC

## 2016-11-15 MED ORDER — LISINOPRIL 40 MG PO TABS: 40.0000 mg | ORAL_TABLET | Freq: Every day | ORAL | 3 refills | Status: DC

## 2016-11-15 MED ORDER — FENOFIBRATE MICRONIZED 134 MG PO CAPS: 134.0000 mg | ORAL_CAPSULE | Freq: Every day | ORAL | 3 refills | Status: DC

## 2016-11-15 MED ORDER — HYDROCHLOROTHIAZIDE 25 MG PO TABS: 25.0000 mg | ORAL_TABLET | Freq: Every day | ORAL | 3 refills | Status: DC

## 2016-11-15 MED ORDER — VERAPAMIL HCL ER 240 MG PO TBCR: 240.0000 mg | EXTENDED_RELEASE_TABLET | Freq: Every day | ORAL | 0 refills | Status: DC

## 2016-11-15 NOTE — Patient Instructions (Signed)
In a few days you may receive a survey in the mail or online from [employer] regarding your visit with us today. Please take a moment to fill this out. Your feedback is very important to our whole office. It can help us better understand your needs as well as improve your experience and satisfaction. Thank you for taking your time to complete it. We care about you.  Rachid Parham, PA-C  

## 2016-11-15 NOTE — Progress Notes (Signed)
BP (!) 148/74   Pulse 65   Temp 98.1 F (36.7 C) (Oral)   Ht _0  (1.753 m)   Wt 255 lb 3.2 oz (115.8 kg)   BMI 37.69 kg/m    Subjective:    Patient ID: Joel Nurse., male    DOB: [birth year], 58 y.o.   MRN: [id number]  HPI: Joel Borawski. is a 56 y.o. male presenting on 11/15/2016 for Follow-up (3 month ); Hyperlipidemia; and Hypertension  Patient comes in today for recheck on his high blood pressure and hyperlipidemia.  Overall he is stooling well and feeling good.  However his blood pressure is still staying in the 378H-885 systolically.  He is not having any chest pain or peripheral edema.  He denies any shortness of breath.  He has been on metoprolol, lisinopril, hydrochlorothiazide, these are pretty much maxed out.  We have had a long discussion about the medication of clonidine.  It appears that his insurance will pay for the patch.  We are going to have him try to get that and plan to see him back in 4 weeks to recheck all of this.  Relevant past medical, surgical, family and social history reviewed and updated as indicated. Allergies and medications reviewed and updated.  Past Medical History:  Diagnosis Date  . Allergy   . Hepatitis A as child  . Hyperlipidemia   . Hypertension     History reviewed. No pertinent surgical history.  Review of Systems  Constitutional: Negative.  Negative for appetite change, fatigue and fever.  HENT: Negative.   Eyes: Negative.  Negative for pain and visual disturbance.  Respiratory: Negative.  Negative for cough, chest tightness, shortness of breath and wheezing.   Cardiovascular: Negative.  Negative for chest pain, palpitations and leg swelling.  Gastrointestinal: Negative.  Negative for abdominal pain, diarrhea, nausea and vomiting.  Endocrine: Negative.   Genitourinary: Negative.   Musculoskeletal: Negative.   Skin: Negative.  Negative for color change and rash.  Neurological: Negative.  Negative for weakness, numbness and  headaches.  Psychiatric/Behavioral: Negative.     Allergies as of 11/15/2016   No Known Allergies     Medication List       Accurate as of 11/15/16  4:29 PM. Always use your most recent med list.          aspirin 81 MG tablet Take 81 mg by mouth daily.   cetirizine 10 MG tablet Commonly known as:  ZYRTEC Take 10 mg by mouth daily.   cloNIDine 0.1 mg/24hr patch Commonly known as:  CATAPRES - Dosed in mg/24 hr Place 1 patch (0.1 mg total) onto the skin once a week.   fenofibrate micronized 134 MG capsule Commonly known as:  LOFIBRA Take 1 capsule (134 mg total) by mouth daily before breakfast.   hydrochlorothiazide 25 MG tablet Commonly known as:  HYDRODIURIL Take 1 tablet (25 mg total) by mouth daily.   lisinopril 40 MG tablet Commonly known as:  PRINIVIL,ZESTRIL Take 1 tablet (40 mg total) by mouth daily.   metoprolol tartrate 50 MG tablet Commonly known as:  LOPRESSOR Take 1 tablet (50 mg total) by mouth 2 (two) times daily.   multivitamin tablet Take 1 tablet by mouth daily.   simvastatin 20 MG tablet Commonly known as:  ZOCOR Take 1 tablet (20 mg total) by mouth daily.   triamcinolone cream 0.5 % Commonly known as:  KENALOG Apply 1 application topically 3 (three) times daily.   verapamil 240 MG CR  tablet Commonly known as:  CALAN-SR Take 1 tablet (240 mg total) by mouth at bedtime.          Objective:    BP (!) 148/74   Pulse 65   Temp 98.1 F (36.7 C) (Oral)   Ht _0  (1.753 m)   Wt 255 lb 3.2 oz (115.8 kg)   BMI 37.69 kg/m   No Known Allergies  Physical Exam  Constitutional: He appears well-developed and well-nourished. No distress.  HENT:  Head: Normocephalic and atraumatic.  Eyes: Pupils are equal, round, and reactive to light. Conjunctivae and EOM are normal.  Cardiovascular: Normal rate, regular rhythm and normal heart sounds.   Pulmonary/Chest: Effort normal and breath sounds normal. No respiratory distress.  Skin: Skin is  warm and dry.  Psychiatric: He has a normal mood and affect. His behavior is normal.  Nursing note and vitals reviewed.   Results for orders placed or performed in visit on 06/01/16  CMP14+EGFR  Result Value Ref Range   Glucose 82 65 - 99 mg/dL   BUN 9 6 - 24 mg/dL   Creatinine, Ser 0.74 (L) 0.76 - 1.27 mg/dL   GFR calc non Af Amer 102 >59 mL/min/1.73   GFR calc Af Amer 118 >59 mL/min/1.73   BUN/Creatinine Ratio 12 9 - 20   Sodium 138 134 - 144 mmol/L   Potassium 3.5 3.5 - 5.2 mmol/L   Chloride 94 (L) 96 - 106 mmol/L   CO2 26 18 - 29 mmol/L   Calcium 8.8 8.7 - 10.2 mg/dL   Total Protein 7.1 6.0 - 8.5 g/dL   Albumin 4.1 3.5 - 5.5 g/dL   Globulin, Total 3.0 1.5 - 4.5 g/dL   Albumin/Globulin Ratio 1.4 1.2 - 2.2   Bilirubin Total 0.5 0.0 - 1.2 mg/dL   Alkaline Phosphatase 56 39 - 117 IU/L   AST 20 0 - 40 IU/L   ALT 11 0 - 44 IU/L  CBC with Differential/Platelet  Result Value Ref Range   WBC 8.3 3.4 - 10.8 x10E3/uL   RBC 4.70 4.14 - 5.80 x10E6/uL   Hemoglobin 15.0 13.0 - 17.7 g/dL   Hematocrit 43.7 37.5 - 51.0 %   MCV 93 79 - 97 fL   MCH 31.9 26.6 - 33.0 pg   MCHC 34.3 31.5 - 35.7 g/dL   RDW 13.1 12.3 - 15.4 %   Platelets 391 (H) 150 - 379 x10E3/uL   Neutrophils 61 Not Estab. %   Lymphs 22 Not Estab. %   Monocytes 9 Not Estab. %   Eos 6 Not Estab. %   Basos 1 Not Estab. %   Neutrophils Absolute 5.1 1.4 - 7.0 x10E3/uL   Lymphocytes Absolute 1.8 0.7 - 3.1 x10E3/uL   Monocytes Absolute 0.7 0.1 - 0.9 x10E3/uL   EOS (ABSOLUTE) 0.5 (H) 0.0 - 0.4 x10E3/uL   Basophils Absolute 0.1 0.0 - 0.2 x10E3/uL   Immature Granulocytes 1 Not Estab. %   Immature Grans (Abs) 0.1 0.0 - 0.1 x10E3/uL  Lipid panel  Result Value Ref Range   Cholesterol, Total 140 100 - 199 mg/dL   Triglycerides 76 0 - 149 mg/dL   HDL 42 >39 mg/dL   VLDL Cholesterol Cal 15 5 - 40 mg/dL   LDL Calculated 83 0 - 99 mg/dL   Chol/HDL Ratio 3.3 0.0 - 5.0 ratio  PSA  Result Value Ref Range   Prostate Specific Ag,  Serum 2.0 0.0 - 4.0 ng/mL      Assessment &  Plan:   1. Essential hypertension - verapamil (CALAN-SR) 240 MG CR tablet; Take 1 tablet (240 mg total) by mouth at bedtime.  Dispense: 90 tablet; Refill: 0 - cloNIDine (CATAPRES - DOSED IN MG/24 HR) 0.1 mg/24hr patch; Place 1 patch (0.1 mg total) onto the skin once a week.  Dispense: 4 patch; Refill: 12 - hydrochlorothiazide (HYDRODIURIL) 25 MG tablet; Take 1 tablet (25 mg total) by mouth daily.  Dispense: 90 tablet; Refill: 3 - lisinopril (PRINIVIL,ZESTRIL) 40 MG tablet; Take 1 tablet (40 mg total) by mouth daily.  Dispense: 90 tablet; Refill: 3 - metoprolol tartrate (LOPRESSOR) 50 MG tablet; Take 1 tablet (50 mg total) by mouth 2 (two) times daily.  Dispense: 180 tablet; Refill: 3  2. Mixed hyperlipidemia - fenofibrate micronized (LOFIBRA) 134 MG capsule; Take 1 capsule (134 mg total) by mouth daily before breakfast.  Dispense: 90 capsule; Refill: 3 - simvastatin (ZOCOR) 20 MG tablet; Take 1 tablet (20 mg total) by mouth daily.  Dispense: 90 tablet; Refill: 3    Current Outpatient Prescriptions:  .  aspirin 81 MG tablet, Take 81 mg by mouth daily., Disp: , Rfl:  .  cetirizine (ZYRTEC) 10 MG tablet, Take 10 mg by mouth daily., Disp: , Rfl:  .  fenofibrate micronized (LOFIBRA) 134 MG capsule, Take 1 capsule (134 mg total) by mouth daily before breakfast., Disp: 90 capsule, Rfl: 3 .  hydrochlorothiazide (HYDRODIURIL) 25 MG tablet, Take 1 tablet (25 mg total) by mouth daily., Disp: 90 tablet, Rfl: 3 .  lisinopril (PRINIVIL,ZESTRIL) 40 MG tablet, Take 1 tablet (40 mg total) by mouth daily., Disp: 90 tablet, Rfl: 3 .  metoprolol tartrate (LOPRESSOR) 50 MG tablet, Take 1 tablet (50 mg total) by mouth 2 (two) times daily., Disp: 180 tablet, Rfl: 3 .  Multiple Vitamin (MULTIVITAMIN) tablet, Take 1 tablet by mouth daily., Disp: , Rfl:  .  simvastatin (ZOCOR) 20 MG tablet, Take 1 tablet (20 mg total) by mouth daily., Disp: 90 tablet, Rfl: 3 .   triamcinolone cream (KENALOG) 0.5 %, Apply 1 application topically 3 (three) times daily., Disp: 30 g, Rfl: 0 .  verapamil (CALAN-SR) 240 MG CR tablet, Take 1 tablet (240 mg total) by mouth at bedtime., Disp: 90 tablet, Rfl: 0 .  cloNIDine (CATAPRES - DOSED IN MG/24 HR) 0.1 mg/24hr patch, Place 1 patch (0.1 mg total) onto the skin once a week., Disp: 4 patch, Rfl: 12 Continue all other maintenance medications as listed above.  Follow up plan: Return in about 4 weeks (around 12/13/2016) for recheck.  Educational handout given for [place] PA-C [facility] [address]  [place], [place] [address] [phone]   11/15/2016, 4:29 PM

## 2016-12-18 ENCOUNTER — Encounter: Payer: Self-pay | Admitting: Physician Assistant

## 2016-12-18 ENCOUNTER — Ambulatory Visit (INDEPENDENT_AMBULATORY_CARE_PROVIDER_SITE_OTHER): Payer: BLUE CROSS/BLUE SHIELD | Admitting: Physician Assistant

## 2016-12-18 VITALS — BP 154/85 | HR 70 | Temp 97.5°F | Ht 69.0 in | Wt 257.0 lb

## 2016-12-18 DIAGNOSIS — I10 Essential (primary) hypertension: Secondary | ICD-10-CM | POA: Diagnosis not present

## 2016-12-18 MED ORDER — OLMESARTAN MEDOXOMIL 40 MG PO TABS: 20.0000 mg | ORAL_TABLET | Freq: Two times a day (BID) | ORAL | 5 refills | Status: DC

## 2016-12-18 NOTE — Patient Instructions (Signed)
DASH Eating Plan DASH stands for "Dietary Approaches to Stop Hypertension." The DASH eating plan is a healthy eating plan that has been shown to reduce high blood pressure (hypertension). It may also reduce your risk for type 2 diabetes, heart disease, and stroke. The DASH eating plan may also help with weight loss. What are tips for following this plan? General guidelines  Avoid eating more than 2,300 mg (milligrams) of salt (sodium) a day. If you have hypertension, you may need to reduce your sodium intake to 1,500 mg a day.  Limit alcohol intake to no more than 1 drink a day for nonpregnant women and 2 drinks a day for men. One drink equals 12 oz of beer, 5 oz of wine, or 1 oz of hard liquor.  Work with your health care provider to maintain a healthy body weight or to lose weight. Ask what an ideal weight is for you.  Get at least 30 minutes of exercise that causes your heart to beat faster (aerobic exercise) most days of the week. Activities may include walking, swimming, or biking.  Work with your health care provider or diet and nutrition specialist (dietitian) to adjust your eating plan to your individual calorie needs. Reading food labels  Check food labels for the amount of sodium per serving. Choose foods with less than 5 percent of the Daily Value of sodium. Generally, foods with less than 300 mg of sodium per serving fit into this eating plan.  To find whole grains, look for the word "whole" as the first word in the ingredient list. Shopping  Buy products labeled as "low-sodium" or "no salt added."  Buy fresh foods. Avoid canned foods and premade or frozen meals. Cooking  Avoid adding salt when cooking. Use salt-free seasonings or herbs instead of table salt or sea salt. Check with your health care provider or pharmacist before using salt substitutes.  Do not fry foods. Cook foods using healthy methods such as baking, boiling, grilling, and broiling instead.  Cook with  heart-healthy oils, such as olive, canola, soybean, or sunflower oil. Meal planning   Eat a balanced diet that includes: ? 5 or more servings of fruits and vegetables each day. At each meal, try to fill half of your plate with fruits and vegetables. ? Up to 6-8 servings of whole grains each day. ? Less than 6 oz of lean meat, poultry, or fish each day. A 3-oz serving of meat is about the same size as a deck of cards. One egg equals 1 oz. ? 2 servings of low-fat dairy each day. ? A serving of nuts, seeds, or beans 5 times each week. ? Heart-healthy fats. Healthy fats called Omega-3 fatty acids are found in foods such as flaxseeds and coldwater fish, like sardines, salmon, and mackerel.  Limit how much you eat of the following: ? Canned or prepackaged foods. ? Food that is high in trans fat, such as fried foods. ? Food that is high in saturated fat, such as fatty meat. ? Sweets, desserts, sugary drinks, and other foods with added sugar. ? Full-fat dairy products.  Do not salt foods before eating.  Try to eat at least 2 vegetarian meals each week.  Eat more home-cooked food and less restaurant, buffet, and fast food.  When eating at a restaurant, ask that your food be prepared with less salt or no salt, if possible. What foods are recommended? The items listed may not be a complete list. Talk with your dietitian about what   dietary choices are best for you. Grains Whole-grain or whole-wheat bread. Whole-grain or whole-wheat pasta. Brown rice. Oatmeal. Quinoa. Bulgur. Whole-grain and low-sodium cereals. Pita bread. Low-fat, low-sodium crackers. Whole-wheat flour tortillas. Vegetables Fresh or frozen vegetables (raw, steamed, roasted, or grilled). Low-sodium or reduced-sodium tomato and vegetable juice. Low-sodium or reduced-sodium tomato sauce and tomato paste. Low-sodium or reduced-sodium canned vegetables. Fruits All fresh, dried, or frozen fruit. Canned fruit in natural juice (without  added sugar). Meat and other protein foods Skinless chicken or turkey. Ground chicken or turkey. Pork with fat trimmed off. Fish and seafood. Egg whites. Dried beans, peas, or lentils. Unsalted nuts, nut butters, and seeds. Unsalted canned beans. Lean cuts of beef with fat trimmed off. Low-sodium, lean deli meat. Dairy Low-fat (1%) or fat-free (skim) milk. Fat-free, low-fat, or reduced-fat cheeses. Nonfat, low-sodium ricotta or cottage cheese. Low-fat or nonfat yogurt. Low-fat, low-sodium cheese. Fats and oils Soft margarine without trans fats. Vegetable oil. Low-fat, reduced-fat, or light mayonnaise and salad dressings (reduced-sodium). Canola, safflower, olive, soybean, and sunflower oils. Avocado. Seasoning and other foods Herbs. Spices. Seasoning mixes without salt. Unsalted popcorn and pretzels. Fat-free sweets. What foods are not recommended? The items listed may not be a complete list. Talk with your dietitian about what dietary choices are best for you. Grains Baked goods made with fat, such as croissants, muffins, or some breads. Dry pasta or rice meal packs. Vegetables Creamed or fried vegetables. Vegetables in a cheese sauce. Regular canned vegetables (not low-sodium or reduced-sodium). Regular canned tomato sauce and paste (not low-sodium or reduced-sodium). Regular tomato and vegetable juice (not low-sodium or reduced-sodium). Pickles. Olives. Fruits Canned fruit in a light or heavy syrup. Fried fruit. Fruit in cream or butter sauce. Meat and other protein foods Fatty cuts of meat. Ribs. Fried meat. Bacon. Sausage. Bologna and other processed lunch meats. Salami. Fatback. Hotdogs. Bratwurst. Salted nuts and seeds. Canned beans with added salt. Canned or smoked fish. Whole eggs or egg yolks. Chicken or turkey with skin. Dairy Whole or 2% milk, cream, and half-and-half. Whole or full-fat cream cheese. Whole-fat or sweetened yogurt. Full-fat cheese. Nondairy creamers. Whipped toppings.  Processed cheese and cheese spreads. Fats and oils Butter. Stick margarine. Lard. Shortening. Ghee. Bacon fat. Tropical oils, such as coconut, palm kernel, or palm oil. Seasoning and other foods Salted popcorn and pretzels. Onion salt, garlic salt, seasoned salt, table salt, and sea salt. Worcestershire sauce. Tartar sauce. Barbecue sauce. Teriyaki sauce. Soy sauce, including reduced-sodium. Steak sauce. Canned and packaged gravies. Fish sauce. Oyster sauce. Cocktail sauce. Horseradish that you find on the shelf. Ketchup. Mustard. Meat flavorings and tenderizers. Bouillon cubes. Hot sauce and Tabasco sauce. Premade or packaged marinades. Premade or packaged taco seasonings. Relishes. Regular salad dressings. Where to find more information:  National Heart, Lung, and Blood Institute: www.nhlbi.nih.gov  American Heart Association: www.heart.org Summary  The DASH eating plan is a healthy eating plan that has been shown to reduce high blood pressure (hypertension). It may also reduce your risk for type 2 diabetes, heart disease, and stroke.  With the DASH eating plan, you should limit salt (sodium) intake to 2,300 mg a day. If you have hypertension, you may need to reduce your sodium intake to 1,500 mg a day.  When on the DASH eating plan, aim to eat more fresh fruits and vegetables, whole grains, lean proteins, low-fat dairy, and heart-healthy fats.  Work with your health care provider or diet and nutrition specialist (dietitian) to adjust your eating plan to your individual   calorie needs. This information is not intended to replace advice given to you by your health care provider. Make sure you discuss any questions you have with your health care provider. Document Released: 12/22/2010 Document Revised: 12/27/2015 Document Reviewed: 12/27/2015 Elsevier Interactive Patient Education  2017 Elsevier Inc.  

## 2016-12-19 NOTE — Progress Notes (Signed)
BP (!) 154/85   Pulse 70   Temp (!) 97.5 F (36.4 C) (Oral)   Ht 5\' 9"  (1.753 m)   Wt 257 lb (116.6 kg)   BMI 37.95 kg/m    Subjective:    Patient ID: Joel GraffJoe Vanbrocklin Jr., male    DOB: May 17, 1960, 56 y.o.   MRN: [id number]  HPI: Joel GraffJoe Pair Jr. is a 56 y.o. male presenting on 12/18/2016 for Follow-up (1 month ) and Hypertension  Patient comes in for recheck on his blood pressure.  He could not tolerate the clonidine patch.  He had a tremendous amount of swelling.  His blood pressure did go down.  We are going to rearrange some of his medications and try to get a full 24-hour coverage by using some old losartan twice daily and he agrees with trying this.  We will plan to recheck him in a few weeks.  Relevant past medical, surgical, family and social history reviewed and updated as indicated. Allergies and medications reviewed and updated.  Past Medical History:  Diagnosis Date  . Allergy   . Hepatitis A as child  . Hyperlipidemia   . Hypertension     History reviewed. No pertinent surgical history.  Review of Systems  Constitutional: Negative.  Negative for appetite change and fatigue.  Eyes: Negative for pain and visual disturbance.  Respiratory: Negative.  Negative for cough, chest tightness, shortness of breath and wheezing.   Cardiovascular: Negative.  Negative for chest pain, palpitations and leg swelling.  Gastrointestinal: Negative.  Negative for abdominal pain, diarrhea, nausea and vomiting.  Genitourinary: Negative.   Skin: Negative.  Negative for color change and rash.  Neurological: Negative.  Negative for weakness, numbness and headaches.  Psychiatric/Behavioral: Negative.     Allergies as of 12/18/2016      Reactions   Clonidine Derivatives    edema   Norvasc [amlodipine Besylate]    edema      Medication List        Accurate as of 12/18/16 11:59 PM. Always use your most recent med list.          aspirin 81 MG tablet Take 81 mg by mouth daily.     cetirizine 10 MG tablet Commonly known as:  ZYRTEC Take 10 mg by mouth daily.   fenofibrate micronized 134 MG capsule Commonly known as:  LOFIBRA Take 1 capsule (134 mg total) by mouth daily before breakfast.   hydrochlorothiazide 25 MG tablet Commonly known as:  HYDRODIURIL Take 1 tablet (25 mg total) by mouth daily.   metoprolol tartrate 50 MG tablet Commonly known as:  LOPRESSOR Take 1 tablet (50 mg total) by mouth 2 (two) times daily.   multivitamin tablet Take 1 tablet by mouth daily.   olmesartan 40 MG tablet Commonly known as:  BENICAR Take 0.5 tablets (20 mg total) by mouth 2 (two) times daily.   simvastatin 20 MG tablet Commonly known as:  ZOCOR Take 1 tablet (20 mg total) by mouth daily.   triamcinolone cream 0.5 % Commonly known as:  KENALOG Apply 1 application topically 3 (three) times daily.   verapamil 240 MG CR tablet Commonly known as:  CALAN-SR Take 1 tablet (240 mg total) by mouth at bedtime.          Objective:    BP (!) 154/85   Pulse 70   Temp (!) 97.5 F (36.4 C) (Oral)   Ht 5\' 9"  (1.753 m)   Wt 257 lb (116.6 kg)   BMI  37.95 kg/m   Allergies  Allergen Reactions  . Clonidine Derivatives     edema  . Norvasc [Amlodipine Besylate]     edema    Physical Exam  Constitutional: He appears well-developed and well-nourished.  HENT:  Head: Normocephalic and atraumatic.  Eyes: Conjunctivae and EOM are normal. Pupils are equal, round, and reactive to light.  Neck: Normal range of motion. Neck supple.  Cardiovascular: Normal rate, regular rhythm and normal heart sounds.  Pulmonary/Chest: Effort normal and breath sounds normal.  Abdominal: Soft. Bowel sounds are normal.  Musculoskeletal: Normal range of motion.  Skin: Skin is warm and dry.  Nursing note and vitals reviewed.       Assessment & Plan:   1. Essential hypertension - olmesartan (BENICAR) 40 MG tablet; Take 0.5 tablets (20 mg total) by mouth 2 (two) times daily.   Dispense: 30 tablet; Refill: 5    Current Outpatient Medications:  .  aspirin 81 MG tablet, Take 81 mg by mouth daily., Disp: , Rfl:  .  cetirizine (ZYRTEC) 10 MG tablet, Take 10 mg by mouth daily., Disp: , Rfl:  .  fenofibrate micronized (LOFIBRA) 134 MG capsule, Take 1 capsule (134 mg total) by mouth daily before breakfast., Disp: 90 capsule, Rfl: 3 .  hydrochlorothiazide (HYDRODIURIL) 25 MG tablet, Take 1 tablet (25 mg total) by mouth daily., Disp: 90 tablet, Rfl: 3 .  metoprolol tartrate (LOPRESSOR) 50 MG tablet, Take 1 tablet (50 mg total) by mouth 2 (two) times daily., Disp: 180 tablet, Rfl: 3 .  Multiple Vitamin (MULTIVITAMIN) tablet, Take 1 tablet by mouth daily., Disp: , Rfl:  .  olmesartan (BENICAR) 40 MG tablet, Take 0.5 tablets (20 mg total) by mouth 2 (two) times daily., Disp: 30 tablet, Rfl: 5 .  simvastatin (ZOCOR) 20 MG tablet, Take 1 tablet (20 mg total) by mouth daily., Disp: 90 tablet, Rfl: 3 .  triamcinolone cream (KENALOG) 0.5 %, Apply 1 application topically 3 (three) times daily., Disp: 30 g, Rfl: 0 .  verapamil (CALAN-SR) 240 MG CR tablet, Take 1 tablet (240 mg total) by mouth at bedtime., Disp: 90 tablet, Rfl: 0 Continue all other maintenance medications as listed above.  Follow up plan: Return in about 4 months (around 04/18/2017) for recheck.  Educational handout given for survey  Remus LofflerAngel S. Sayvion Vigen PA-C [facility] [address]  [place], [place] [id number] [phone]   12/19/2016, 10:56 AM

## 2017-02-14 ENCOUNTER — Other Ambulatory Visit: Payer: Self-pay | Admitting: Physician Assistant

## 2017-02-14 DIAGNOSIS — I10 Essential (primary) hypertension: Secondary | ICD-10-CM

## 2017-02-23 ENCOUNTER — Encounter: Payer: Self-pay | Admitting: Pediatrics

## 2017-02-23 ENCOUNTER — Ambulatory Visit (INDEPENDENT_AMBULATORY_CARE_PROVIDER_SITE_OTHER): Payer: BLUE CROSS/BLUE SHIELD | Admitting: Pediatrics

## 2017-02-23 VITALS — BP 138/77 | HR 90 | Temp 97.6°F | Resp 22 | Ht 69.0 in | Wt 260.0 lb

## 2017-02-23 DIAGNOSIS — J069 Acute upper respiratory infection, unspecified: Secondary | ICD-10-CM

## 2017-02-23 NOTE — Progress Notes (Signed)
  Subjective:   Patient ID: Joel GraffJoe Raftery Jr., male    DOB: Nov 07, 1960, 57 y.o.   MRN: [id number] CC: Nasal Congestion; Cough; and Laryngitis  HPI: Joel GraffJoe Bowne Jr. is a 57 y.o. male presenting for Nasal Congestion; Cough; and Laryngitis  Achy starting several days ago Two days ago started having some nasal congestion Subjective fevers at home Appetite has been fine Non-productive cough No wheezing or SOB  Relevant past medical, surgical, family and social history reviewed. Allergies and medications reviewed and updated. Social History   Tobacco Use  Smoking Status Never Smoker  Smokeless Tobacco Never Used   ROS: Per HPI   Objective:    BP 138/77   Pulse 90   Temp 97.6 F (36.4 C) (Oral)   Resp (!) 22   Ht 5\' 9"  (1.753 m)   Wt 260 lb (117.9 kg)   SpO2 96%   BMI 38.40 kg/m   Wt Readings from Last 3 Encounters:  02/23/17 260 lb (117.9 kg)  12/18/16 257 lb (116.6 kg)  11/15/16 255 lb 3.2 oz (115.8 kg)    Gen: NAD, alert, cooperative with exam, NCAT EYES: EOMI, no conjunctival injection, or no icterus ENT:  TMs dull gray b/l, OP with mild erythema LYMPH: no cervical LAD CV: NRRR, normal S1/S2, no murmur, distal pulses 2+ b/l Resp: CTABL, no wheezes, normal WOB Abd: +BS, soft, NTND. no guarding or organomegaly Ext: No edema, warm Neuro: Alert and oriented, strength equal b/l UE and LE, coordination grossly normal MSK: normal muscle bulk  Assessment & Plan:  Joel RungJoe was seen today for nasal congestion, cough and laryngitis.  Diagnoses and all orders for this visit:  Acute URI Symptom care and return precautions discussed  Follow up plan: Return if symptoms worsen or fail to improve. Rex Krasarol Janell Keeling, MD [facility]

## 2017-02-23 NOTE — Patient Instructions (Signed)
Netipot with distilled water 2-3 times a day to clear out sinuses.  Or Normal saline nasal spray Flonase steroid nasal spray Ibuprofen 600mg  three times a day Lots of fluids Continue daily antihistamine/allergy pill   NASAL STEROID USE INSTRUCTIONS  Step 1. Prepare the nose. Blow the nose before administering the drug.  Step 2. Prime and activate the delivery device as recommended by the manufacturer.  Step 3. Position the head by tilting the head forward.  Step 4. Insert the tip of the applicator gently, avoiding contact with the septum.  Step 5. Aim the applicator tip about 45 from the floor of the nose and direct it at the outer corner of the eye on the same side to avoid traumatizing or spraying the septum.  Step 6. Close the other nostril gently with a finger.   Step 7. Sniff or inhale gently while delivering the drug.   BUFFERED ISOTONIC SALINE NASAL IRRIGATION (Neti pot)  The Benefits:  1. When you irrigate, the isotonic saline (salt water) acts as a solvent and washes the mucus crusts and other debris from your nose.  2. This decongests and improves the airflow into your nose. The sinus passages begin to open.  3. Studies have also shown that a salt water and an alkaline (baking soda) irrigation solution improves nasal membrane cell function (mucociliary flow of mucus debris).  The Recipe:  1. Choose a 1-quart glass jar that is thoroughly cleansed.  2. Fill with sterile or distilled water, or you can boil water from the tap.  3. Add 1 to 2 heaping teaspoons of "pickling/canning/sea" salt (NOT table salt as it contains a large number of additives). This salt is available at the grocery store in the food canning section.  4. Add 1 teaspoon of [employer] Baking Soda (pure bicarbonate).  5. Mix ingredients together and store at room temperature. Discard after one week. If you find this solution too strong, you may decrease the amount of salt added to 1 to 1   teaspoons. With children it is often best to start with a milder solution and advance slowly. Irrigate with 240 ml (8 oz) twice daily.  The Instructions:  You should plan to irrigate your nose with buffered isotonic saline 2 times per day. Many people prefer to warm the solution slightly in the microwave - but be sure that the solution is NOT HOT. Stand over the sink (some do this in the shower) and squirt the solution into each side of your nose, keeping your mouth open. This allows you to spit the saltwater out of your mouth. It will not harm you if you swallow a little.  If you have been told to use a nasal steroid such as Flonase, Nasonex, or Nasacort, you should always use isortonic saline solution first, then use your nasal steroid product. The nasal steroid is much more effective when sprayed onto clean nasal membranes and the steroid medicine will reach deeper into the nose.  Most people experience a little burning sensation the first few times they use a isotonic saline solution, but this usually goes away within a few days.

## 2017-04-20 ENCOUNTER — Ambulatory Visit: Payer: BLUE CROSS/BLUE SHIELD | Admitting: Physician Assistant

## 2017-04-23 ENCOUNTER — Encounter: Payer: Self-pay | Admitting: Physician Assistant

## 2017-04-23 ENCOUNTER — Ambulatory Visit (INDEPENDENT_AMBULATORY_CARE_PROVIDER_SITE_OTHER): Payer: BLUE CROSS/BLUE SHIELD | Admitting: Physician Assistant

## 2017-04-23 DIAGNOSIS — I10 Essential (primary) hypertension: Secondary | ICD-10-CM | POA: Diagnosis not present

## 2017-04-23 MED ORDER — DOXAZOSIN MESYLATE 4 MG PO TABS: 4.0000 mg | ORAL_TABLET | Freq: Every day | ORAL | 0 refills | Status: DC

## 2017-04-23 NOTE — Progress Notes (Signed)
BP (!) 165/89   Pulse 65   Temp 98.7 F (37.1 C) (Oral)   Ht '5\' 9"'  (1.753 m)   Wt 263 lb (119.3 kg)   BMI 38.84 kg/m    Subjective:    Patient ID: Joel Nurse., male    DOB: [birth year], 57 y.o.   MRN: [id number]  HPI: Joel Pulis. is a 57 y.o. male presenting on 04/23/2017 for Follow-up (4 month )  Blood pressure is still up and he is having more edema in legs. He denies chest pain, dyspnea, change in activity.  Intolerant to clonidine and amlodipine.  Past Medical History:  Diagnosis Date  . Allergy   . Hepatitis A as child  . Hyperlipidemia   . Hypertension    Relevant past medical, surgical, family and social history reviewed and updated as indicated. Interim medical history since our last visit reviewed. Allergies and medications reviewed and updated. DATA REVIEWED: CHART IN EPIC  Family History reviewed for pertinent findings.  Review of Systems  Constitutional: Negative.  Negative for appetite change and fatigue.  Eyes: Negative for pain and visual disturbance.  Respiratory: Negative.  Negative for cough, chest tightness, shortness of breath and wheezing.   Cardiovascular: Negative.  Negative for chest pain, palpitations and leg swelling.  Gastrointestinal: Negative.  Negative for abdominal pain, diarrhea, nausea and vomiting.  Genitourinary: Negative.   Skin: Negative.  Negative for color change and rash.  Neurological: Negative.  Negative for weakness, numbness and headaches.  Psychiatric/Behavioral: Negative.     Allergies as of 04/23/2017      Reactions   Clonidine Derivatives    edema   Norvasc [amlodipine Besylate]    edema      Medication List        Accurate as of 04/23/17  2:30 PM. Always use your most recent med list.          aspirin 81 MG tablet Take 81 mg by mouth daily.   cetirizine 10 MG tablet Commonly known as:  ZYRTEC Take 10 mg by mouth daily.   doxazosin 4 MG tablet Commonly known as:  CARDURA Take 1 tablet (4 mg total) by  mouth daily.   fenofibrate micronized 134 MG capsule Commonly known as:  LOFIBRA Take 1 capsule (134 mg total) by mouth daily before breakfast.   hydrochlorothiazide 25 MG tablet Commonly known as:  HYDRODIURIL Take 1 tablet (25 mg total) by mouth daily.   metoprolol tartrate 50 MG tablet Commonly known as:  LOPRESSOR Take 1 tablet (50 mg total) by mouth 2 (two) times daily.   multivitamin tablet Take 1 tablet by mouth daily.   olmesartan 40 MG tablet Commonly known as:  BENICAR Take 0.5 tablets (20 mg total) by mouth 2 (two) times daily.   simvastatin 20 MG tablet Commonly known as:  ZOCOR Take 1 tablet (20 mg total) by mouth daily.   triamcinolone cream 0.5 % Commonly known as:  KENALOG Apply 1 application topically 3 (three) times daily.          Objective:    BP (!) 165/89   Pulse 65   Temp 98.7 F (37.1 C) (Oral)   Ht '5\' 9"'  (1.753 m)   Wt 263 lb (119.3 kg)   BMI 38.84 kg/m   Allergies  Allergen Reactions  . Clonidine Derivatives     edema  . Norvasc [Amlodipine Besylate]     edema    Wt Readings from Last 3 Encounters:  04/23/17 263 lb (119.3  kg)  02/23/17 260 lb (117.9 kg)  12/18/16 257 lb (116.6 kg)    Physical Exam  Constitutional: He appears well-developed and well-nourished. No distress.  HENT:  Head: Normocephalic and atraumatic.  Eyes: Pupils are equal, round, and reactive to light. Conjunctivae and EOM are normal.  Cardiovascular: Normal rate, regular rhythm and normal heart sounds.  Pulmonary/Chest: Effort normal and breath sounds normal. No respiratory distress.  Skin: Skin is warm and dry.  Psychiatric: He has a normal mood and affect. His behavior is normal.  Nursing note and vitals reviewed.   Results for orders placed or performed in visit on 06/01/16  CMP14+EGFR  Result Value Ref Range   Glucose 82 65 - 99 mg/dL   BUN 9 6 - 24 mg/dL   Creatinine, Ser 0.74 (L) 0.76 - 1.27 mg/dL   GFR calc non Af Amer 102 >59 mL/min/1.73    GFR calc Af Amer 118 >59 mL/min/1.73   BUN/Creatinine Ratio 12 9 - 20   Sodium 138 134 - 144 mmol/L   Potassium 3.5 3.5 - 5.2 mmol/L   Chloride 94 (L) 96 - 106 mmol/L   CO2 26 18 - 29 mmol/L   Calcium 8.8 8.7 - 10.2 mg/dL   Total Protein 7.1 6.0 - 8.5 g/dL   Albumin 4.1 3.5 - 5.5 g/dL   Globulin, Total 3.0 1.5 - 4.5 g/dL   Albumin/Globulin Ratio 1.4 1.2 - 2.2   Bilirubin Total 0.5 0.0 - 1.2 mg/dL   Alkaline Phosphatase 56 39 - 117 IU/L   AST 20 0 - 40 IU/L   ALT 11 0 - 44 IU/L  CBC with Differential/Platelet  Result Value Ref Range   WBC 8.3 3.4 - 10.8 x10E3/uL   RBC 4.70 4.14 - 5.80 x10E6/uL   Hemoglobin 15.0 13.0 - 17.7 g/dL   Hematocrit 43.7 37.5 - 51.0 %   MCV 93 79 - 97 fL   MCH 31.9 26.6 - 33.0 pg   MCHC 34.3 31.5 - 35.7 g/dL   RDW 13.1 12.3 - 15.4 %   Platelets 391 (H) 150 - 379 x10E3/uL   Neutrophils 61 Not Estab. %   Lymphs 22 Not Estab. %   Monocytes 9 Not Estab. %   Eos 6 Not Estab. %   Basos 1 Not Estab. %   Neutrophils Absolute 5.1 1.4 - 7.0 x10E3/uL   Lymphocytes Absolute 1.8 0.7 - 3.1 x10E3/uL   Monocytes Absolute 0.7 0.1 - 0.9 x10E3/uL   EOS (ABSOLUTE) 0.5 (H) 0.0 - 0.4 x10E3/uL   Basophils Absolute 0.1 0.0 - 0.2 x10E3/uL   Immature Granulocytes 1 Not Estab. %   Immature Grans (Abs) 0.1 0.0 - 0.1 x10E3/uL  Lipid panel  Result Value Ref Range   Cholesterol, Total 140 100 - 199 mg/dL   Triglycerides 76 0 - 149 mg/dL   HDL 42 >39 mg/dL   VLDL Cholesterol Cal 15 5 - 40 mg/dL   LDL Calculated 83 0 - 99 mg/dL   Chol/HDL Ratio 3.3 0.0 - 5.0 ratio  PSA  Result Value Ref Range   Prostate Specific Ag, Serum 2.0 0.0 - 4.0 ng/mL      Assessment & Plan:   1. Essential hypertension - doxazosin (CARDURA) 4 MG tablet; Take 1 tablet (4 mg total) by mouth daily.  Dispense: 30 tablet; Refill: 0   Continue all other maintenance medications as listed above.  Follow up plan: Recheck 4 weeks  Educational handout given for survey  Terald Sleeper PA-C [facility] [address]  [place], [place] [address] [phone]   04/23/2017, 2:30 PM

## 2017-05-09 ENCOUNTER — Other Ambulatory Visit: Payer: Self-pay | Admitting: Physician Assistant

## 2017-05-09 DIAGNOSIS — I10 Essential (primary) hypertension: Secondary | ICD-10-CM

## 2017-05-15 ENCOUNTER — Other Ambulatory Visit: Payer: Self-pay | Admitting: Physician Assistant

## 2017-05-15 DIAGNOSIS — I10 Essential (primary) hypertension: Secondary | ICD-10-CM

## 2017-05-21 ENCOUNTER — Ambulatory Visit (INDEPENDENT_AMBULATORY_CARE_PROVIDER_SITE_OTHER): Payer: BLUE CROSS/BLUE SHIELD | Admitting: Physician Assistant

## 2017-05-21 ENCOUNTER — Encounter: Payer: Self-pay | Admitting: Physician Assistant

## 2017-05-21 DIAGNOSIS — I10 Essential (primary) hypertension: Secondary | ICD-10-CM

## 2017-05-21 MED ORDER — OLMESARTAN MEDOXOMIL 40 MG PO TABS: 20.0000 mg | ORAL_TABLET | Freq: Two times a day (BID) | ORAL | 3 refills | Status: DC

## 2017-05-21 MED ORDER — DOXAZOSIN MESYLATE 4 MG PO TABS: 4.0000 mg | ORAL_TABLET | Freq: Every day | ORAL | 3 refills | Status: DC

## 2017-05-21 NOTE — Progress Notes (Signed)
BP 132/73   Pulse 75   Temp 97.8 F (36.6 C) (Oral)   Ht  (1.753 m)   Wt 265 lb (120.2 kg)   BMI 39.13 kg/m    Subjective:    Patient ID: Joel Graff., male    DOB: 11-01-1960, 57 y.o.   MRN: [id number]  HPI: Joel Dohrmann. is a 57 y.o. male presenting on 05/21/2017 for Hypertension (4 week rck )  Patient comes in for recheck on his hypertension.  We added Cardura 4 mg 1 daily to his regimen.  He states he is feeling very good with it.  He has had very good blood pressure readings ever since then.  He states he has been checking it about every other day.  He has had a couple times where he felt a little dizzy with standing up with a blood pressure is never low.  Passes very quickly.  He does go a long time from nighttime until the next day at lunch before he eats.  I am encouraged to see if this correlates some with his dizziness.  He does not have diabetes.  Past Medical History:  Diagnosis Date  . Allergy   . Hepatitis A as child  . Hyperlipidemia   . Hypertension    Relevant past medical, surgical, family and social history reviewed and updated as indicated. Interim medical history since our last visit reviewed. Allergies and medications reviewed and updated. DATA REVIEWED: CHART IN EPIC  Family History reviewed for pertinent findings.  Review of Systems  Constitutional: Negative.  Negative for appetite change and fatigue.  HENT: Negative.   Eyes: Negative.  Negative for pain and visual disturbance.  Respiratory: Negative.  Negative for cough, chest tightness, shortness of breath and wheezing.   Cardiovascular: Negative.  Negative for chest pain, palpitations and leg swelling.  Gastrointestinal: Negative.  Negative for abdominal pain, diarrhea, nausea and vomiting.  Endocrine: Negative.   Genitourinary: Negative.   Musculoskeletal: Negative.   Skin: Negative.  Negative for color change and rash.  Neurological: Negative.  Negative for weakness, numbness and  headaches.  Psychiatric/Behavioral: Negative.     Allergies as of 05/21/2017      Reactions   Clonidine Derivatives    edema   Norvasc [amlodipine Besylate]    edema      Medication List        Accurate as of 05/21/17  2:32 PM. Always use your most recent med list.          aspirin 81 MG tablet Take 81 mg by mouth daily.   cetirizine 10 MG tablet Commonly known as:  ZYRTEC Take 10 mg by mouth daily.   doxazosin 4 MG tablet Commonly known as:  CARDURA Take 1 tablet (4 mg total) by mouth daily.   fenofibrate micronized 134 MG capsule Commonly known as:  LOFIBRA Take 1 capsule (134 mg total) by mouth daily before breakfast.   hydrochlorothiazide 25 MG tablet Commonly known as:  HYDRODIURIL Take 1 tablet (25 mg total) by mouth daily.   metoprolol tartrate 50 MG tablet Commonly known as:  LOPRESSOR Take 1 tablet (50 mg total) by mouth 2 (two) times daily.   multivitamin tablet Take 1 tablet by mouth daily.   olmesartan 40 MG tablet Commonly known as:  BENICAR Take 0.5 tablets (20 mg total) by mouth 2 (two) times daily.   simvastatin 20 MG tablet Commonly known as:  ZOCOR Take 1 tablet (20 mg total) by mouth daily.  triamcinolone cream 0.5 % Commonly known as:  KENALOG Apply 1 application topically 3 (three) times daily.          Objective:    BP 132/73   Pulse 75   Temp 97.8 F (36.6 C) (Oral)   Ht  (1.753 m)   Wt 265 lb (120.2 kg)   BMI 39.13 kg/m   Allergies  Allergen Reactions  . Clonidine Derivatives     edema  . Norvasc [Amlodipine Besylate]     edema    Wt Readings from Last 3 Encounters:  05/21/17 265 lb (120.2 kg)  04/23/17 263 lb (119.3 kg)  02/23/17 260 lb (117.9 kg)    Physical Exam  Constitutional: He appears well-developed and well-nourished. No distress.  HENT:  Head: Normocephalic and atraumatic.  Eyes: Pupils are equal, round, and reactive to light. Conjunctivae and EOM are normal.  Cardiovascular: Normal rate,  regular rhythm and normal heart sounds.  Pulmonary/Chest: Effort normal and breath sounds normal. No respiratory distress.  Skin: Skin is warm and dry.  Psychiatric: He has a normal mood and affect. His behavior is normal.  Nursing note and vitals reviewed.       Assessment & Plan:   1. Essential hypertension - doxazosin (CARDURA) 4 MG tablet; Take 1 tablet (4 mg total) by mouth daily.  Dispense: 90 tablet; Refill: 3 - olmesartan (BENICAR) 40 MG tablet; Take 0.5 tablets (20 mg total) by mouth 2 (two) times daily.  Dispense: 90 tablet; Refill: 3   Continue all other maintenance medications as listed above.  Follow up plan: Return in about 6 months (around 11/21/2017) for recheck.  Educational handout given for survey  Joel Loffler PA-C [facility] [address]  [place], [place] [address] [phone]   05/21/2017, 2:32 PM

## 2017-05-21 NOTE — Patient Instructions (Signed)
In a few days you may receive a survey in the mail or online from [employer] regarding your visit with us today. Please take a moment to fill this out. Your feedback is very important to our whole office. It can help us better understand your needs as well as improve your experience and satisfaction. Thank you for taking your time to complete it. We care about you.  Deaja Rizo, PA-C  

## 2017-07-03 ENCOUNTER — Other Ambulatory Visit: Payer: Self-pay | Admitting: Physician Assistant

## 2017-07-03 NOTE — Telephone Encounter (Signed)
Spoke with pharmacist at [facility].  Patient was prescribed Olmesartan 40 mg, #90.  They only have 60 tablets available and have been unable to locate 30 more to complete the order.  I advised them to go ahead and fill the 60 and continue to find somewhere to reorder and restock before next prescription is due.  Patient is aware.

## 2017-11-20 ENCOUNTER — Encounter: Payer: Self-pay | Admitting: Physician Assistant

## 2017-11-20 ENCOUNTER — Ambulatory Visit (INDEPENDENT_AMBULATORY_CARE_PROVIDER_SITE_OTHER): Payer: BLUE CROSS/BLUE SHIELD | Admitting: Physician Assistant

## 2017-11-20 VITALS — BP 126/70 | HR 79 | Temp 97.9°F | Ht 69.0 in | Wt 273.4 lb

## 2017-11-20 DIAGNOSIS — Z0000 Encounter for general adult medical examination without abnormal findings: Secondary | ICD-10-CM

## 2017-11-20 DIAGNOSIS — I10 Essential (primary) hypertension: Secondary | ICD-10-CM | POA: Diagnosis not present

## 2017-11-20 DIAGNOSIS — N529 Male erectile dysfunction, unspecified: Secondary | ICD-10-CM

## 2017-11-20 DIAGNOSIS — E782 Mixed hyperlipidemia: Secondary | ICD-10-CM | POA: Diagnosis not present

## 2017-11-20 MED ORDER — SILDENAFIL CITRATE 20 MG PO TABS: 20.0000 mg | ORAL_TABLET | Freq: Every day | ORAL | 5 refills | Status: AC

## 2017-11-20 MED ORDER — SIMVASTATIN 20 MG PO TABS: 20.0000 mg | ORAL_TABLET | Freq: Every day | ORAL | 3 refills | Status: DC

## 2017-11-20 MED ORDER — METOPROLOL TARTRATE 50 MG PO TABS: 50.0000 mg | ORAL_TABLET | Freq: Two times a day (BID) | ORAL | 3 refills | Status: DC

## 2017-11-20 MED ORDER — SPIRONOLACTONE 25 MG PO TABS: 25.0000 mg | ORAL_TABLET | Freq: Every day | ORAL | 2 refills | Status: DC

## 2017-11-20 MED ORDER — FENOFIBRATE MICRONIZED 134 MG PO CAPS: 134.0000 mg | ORAL_CAPSULE | Freq: Every day | ORAL | 3 refills | Status: DC

## 2017-11-21 DIAGNOSIS — N529 Male erectile dysfunction, unspecified: Secondary | ICD-10-CM | POA: Insufficient documentation

## 2017-11-21 NOTE — Progress Notes (Signed)
BP 126/70   Pulse 79   Temp 97.9 F (36.6 C) (Oral)   Ht _0  (1.753 m)   Wt 273 lb 6.4 oz (124 kg)   BMI 40.37 kg/m    Subjective:    Patient ID: Joel Powell Nurse., male    DOB: [birth year], 57 y.o.   MRN: [id number]  HPI: Sarkis Rhines. is a 57 y.o. male presenting on 11/20/2017 for Hypertension (6 month follow up ) and Hyperlipidemia This patient comes in for periodic recheck on his chronic medical conditions.  They do include hypertension, hyperlipidemia, erectile dysfunction.  He is not having any other issues at this time.  His blood pressure still somewhat elevated.  He has been having difficulty getting his ARB blood pressure medicine.  So therefore we are going to make a change.  I am to have him stop his hydrochlorothiazide and Benicar.  We will start Spironolactone.  I will plan to see him back in 4 weeks.  He does need refills on all of his medications.  And sildenafil will be sent to the pharmacy.   Past Medical History:  Diagnosis Date  . Allergy   . Hepatitis A as child  . Hyperlipidemia   . Hypertension    Relevant past medical, surgical, family and social history reviewed and updated as indicated. Interim medical history since our last visit reviewed. Allergies and medications reviewed and updated. DATA REVIEWED: CHART IN EPIC  Family History reviewed for pertinent findings.  Review of Systems  Constitutional: Negative.  Negative for appetite change and fatigue.  HENT: Negative.   Eyes: Negative.  Negative for pain and visual disturbance.  Respiratory: Negative.  Negative for cough, chest tightness, shortness of breath and wheezing.   Cardiovascular: Negative.  Negative for chest pain, palpitations and leg swelling.  Gastrointestinal: Negative.  Negative for abdominal pain, diarrhea, nausea and vomiting.  Endocrine: Negative.   Genitourinary: Negative.   Musculoskeletal: Negative.   Skin: Negative.  Negative for color change and rash.  Neurological: Negative.   Negative for weakness, numbness and headaches.  Psychiatric/Behavioral: Negative.     Allergies as of 11/20/2017      Reactions   Clonidine Derivatives    edema   Norvasc [amlodipine Besylate]    edema      Medication List        Accurate as of 11/20/17 11:59 PM. Always use your most recent med list.          aspirin 81 MG tablet Take 81 mg by mouth daily.   cetirizine 10 MG tablet Commonly known as:  ZYRTEC Take 10 mg by mouth daily.   doxazosin 4 MG tablet Commonly known as:  CARDURA Take 1 tablet (4 mg total) by mouth daily.   fenofibrate micronized 134 MG capsule Commonly known as:  LOFIBRA Take 1 capsule (134 mg total) by mouth daily before breakfast.   metoprolol tartrate 50 MG tablet Commonly known as:  LOPRESSOR Take 1 tablet (50 mg total) by mouth 2 (two) times daily.   multivitamin tablet Take 1 tablet by mouth daily.   sildenafil 20 MG tablet Commonly known as:  REVATIO Take 1-5 tablets (20-100 mg total) by mouth daily.   simvastatin 20 MG tablet Commonly known as:  ZOCOR Take 1 tablet (20 mg total) by mouth daily.   spironolactone 25 MG tablet Commonly known as:  ALDACTONE Take 1 tablet (25 mg total) by mouth daily.   triamcinolone cream 0.5 % Commonly known as:  KENALOG  Apply 1 application topically 3 (three) times daily.          Objective:    BP 126/70   Pulse 79   Temp 97.9 F (36.6 C) (Oral)   Ht _0  (1.753 m)   Wt 273 lb 6.4 oz (124 kg)   BMI 40.37 kg/m   Allergies  Allergen Reactions  . Clonidine Derivatives     edema  . Norvasc [Amlodipine Besylate]     edema    Wt Readings from Last 3 Encounters:  11/20/17 273 lb 6.4 oz (124 kg)  05/21/17 265 lb (120.2 kg)  04/23/17 263 lb (119.3 kg)    Physical Exam  Constitutional: He appears well-developed and well-nourished. No distress.  HENT:  Head: Normocephalic and atraumatic.  Eyes: Pupils are equal, round, and reactive to light. Conjunctivae and EOM are normal.    Cardiovascular: Normal rate, regular rhythm and normal heart sounds.  Pulmonary/Chest: Effort normal and breath sounds normal. No respiratory distress.  Skin: Skin is warm and dry.  Psychiatric: He has a normal mood and affect. His behavior is normal.  Nursing note and vitals reviewed.   Results for orders placed or performed in visit on 06/01/16  CMP14+EGFR  Result Value Ref Range   Glucose 82 65 - 99 mg/dL   BUN 9 6 - 24 mg/dL   Creatinine, Ser 0.74 (L) 0.76 - 1.27 mg/dL   GFR calc non Af Amer 102 >59 mL/min/1.73   GFR calc Af Amer 118 >59 mL/min/1.73   BUN/Creatinine Ratio 12 9 - 20   Sodium 138 134 - 144 mmol/L   Potassium 3.5 3.5 - 5.2 mmol/L   Chloride 94 (L) 96 - 106 mmol/L   CO2 26 18 - 29 mmol/L   Calcium 8.8 8.7 - 10.2 mg/dL   Total Protein 7.1 6.0 - 8.5 g/dL   Albumin 4.1 3.5 - 5.5 g/dL   Globulin, Total 3.0 1.5 - 4.5 g/dL   Albumin/Globulin Ratio 1.4 1.2 - 2.2   Bilirubin Total 0.5 0.0 - 1.2 mg/dL   Alkaline Phosphatase 56 39 - 117 IU/L   AST 20 0 - 40 IU/L   ALT 11 0 - 44 IU/L  CBC with Differential/Platelet  Result Value Ref Range   WBC 8.3 3.4 - 10.8 x10E3/uL   RBC 4.70 4.14 - 5.80 x10E6/uL   Hemoglobin 15.0 13.0 - 17.7 g/dL   Hematocrit 43.7 37.5 - 51.0 %   MCV 93 79 - 97 fL   MCH 31.9 26.6 - 33.0 pg   MCHC 34.3 31.5 - 35.7 g/dL   RDW 13.1 12.3 - 15.4 %   Platelets 391 (H) 150 - 379 x10E3/uL   Neutrophils 61 Not Estab. %   Lymphs 22 Not Estab. %   Monocytes 9 Not Estab. %   Eos 6 Not Estab. %   Basos 1 Not Estab. %   Neutrophils Absolute 5.1 1.4 - 7.0 x10E3/uL   Lymphocytes Absolute 1.8 0.7 - 3.1 x10E3/uL   Monocytes Absolute 0.7 0.1 - 0.9 x10E3/uL   EOS (ABSOLUTE) 0.5 (H) 0.0 - 0.4 x10E3/uL   Basophils Absolute 0.1 0.0 - 0.2 x10E3/uL   Immature Granulocytes 1 Not Estab. %   Immature Grans (Abs) 0.1 0.0 - 0.1 x10E3/uL  Lipid panel  Result Value Ref Range   Cholesterol, Total 140 100 - 199 mg/dL   Triglycerides 76 0 - 149 mg/dL   HDL 42 >39  mg/dL   VLDL Cholesterol Cal 15 5 - 40 mg/dL  LDL Calculated 83 0 - 99 mg/dL   Chol/HDL Ratio 3.3 0.0 - 5.0 ratio  PSA  Result Value Ref Range   Prostate Specific Ag, Serum 2.0 0.0 - 4.0 ng/mL      Assessment & Plan:   1. Essential hypertension - CBC with Differential/Platelet; Future - CMP14+EGFR; Future - Lipid panel; Future - Bayer DCA Hb A1c Waived; Future - PSA; Future - spironolactone (ALDACTONE) 25 MG tablet; Take 1 tablet (25 mg total) by mouth daily.  Dispense: 30 tablet; Refill: 2 - metoprolol tartrate (LOPRESSOR) 50 MG tablet; Take 1 tablet (50 mg total) by mouth 2 (two) times daily.  Dispense: 180 tablet; Refill: 3  2. Mixed hyperlipidemia - fenofibrate micronized (LOFIBRA) 134 MG capsule; Take 1 capsule (134 mg total) by mouth daily before breakfast.  Dispense: 90 capsule; Refill: 3 - simvastatin (ZOCOR) 20 MG tablet; Take 1 tablet (20 mg total) by mouth daily.  Dispense: 90 tablet; Refill: 3  3. Well adult exam - CBC with Differential/Platelet; Future - CMP14+EGFR; Future - Lipid panel; Future - Bayer DCA Hb A1c Waived; Future - PSA; Future  4. Erectile dysfunction, unspecified erectile dysfunction type - sildenafil (REVATIO) 20 MG tablet; Take 1-5 tablets (20-100 mg total) by mouth daily.  Dispense: 30 tablet; Refill: 5   Continue all other maintenance medications as listed above.  Follow up plan: Return in about 4 weeks (around 12/18/2017) for recheck.  Educational handout given for [place] PA-C [facility] [address]  [place], [place] [address] [phone]   11/21/2017, 1:46 PM

## 2017-11-23 ENCOUNTER — Other Ambulatory Visit: Payer: BLUE CROSS/BLUE SHIELD

## 2017-11-23 DIAGNOSIS — I10 Essential (primary) hypertension: Secondary | ICD-10-CM | POA: Diagnosis not present

## 2017-11-23 DIAGNOSIS — Z0000 Encounter for general adult medical examination without abnormal findings: Secondary | ICD-10-CM

## 2017-11-23 LAB — BAYER DCA HB A1C WAIVED: HB A1C (BAYER DCA - WAIVED): 5.2 % (ref <7.0)

## 2017-11-24 LAB — LIPID PANEL
Chol/HDL Ratio: 3 ratio (ref 0.0–5.0)
Cholesterol, Total: 138 mg/dL (ref 100–199)
HDL: 46 mg/dL (ref >39)
LDL Calculated: 72 mg/dL (ref 0–99)
Triglycerides: 100 mg/dL (ref 0–149)
VLDL Cholesterol Cal: 20 mg/dL (ref 5–40)

## 2017-11-24 LAB — CBC WITH DIFFERENTIAL/PLATELET
Basophils Absolute: 0.1 10*3/uL (ref 0.0–0.2)
Basos: 1 %
EOS (ABSOLUTE): 0.5 10*3/uL — ABNORMAL HIGH (ref 0.0–0.4)
Eos: 4 %
Hematocrit: 39.9 % (ref 37.5–51.0)
Hemoglobin: 13.7 g/dL (ref 13.0–17.7)
Immature Grans (Abs): 0 10*3/uL (ref 0.0–0.1)
Immature Granulocytes: 0 %
Lymphocytes Absolute: 2.3 10*3/uL (ref 0.7–3.1)
Lymphs: 19 %
MCH: 31.6 pg (ref 26.6–33.0)
MCHC: 34.3 g/dL (ref 31.5–35.7)
MCV: 92 fL (ref 79–97)
Monocytes Absolute: 1.3 10*3/uL — ABNORMAL HIGH (ref 0.1–0.9)
Monocytes: 10 %
Neutrophils Absolute: 8.2 10*3/uL — ABNORMAL HIGH (ref 1.4–7.0)
Neutrophils: 66 %
Platelets: 411 10*3/uL (ref 150–450)
RBC: 4.33 x10E6/uL (ref 4.14–5.80)
RDW: 12.2 % — ABNORMAL LOW (ref 12.3–15.4)
WBC: 12.5 10*3/uL — ABNORMAL HIGH (ref 3.4–10.8)

## 2017-11-24 LAB — CMP14+EGFR
ALT: 11 IU/L (ref 0–44)
AST: 21 IU/L (ref 0–40)
Albumin/Globulin Ratio: 1.7 (ref 1.2–2.2)
Albumin: 4.3 g/dL (ref 3.5–5.5)
Alkaline Phosphatase: 61 IU/L (ref 39–117)
BUN/Creatinine Ratio: 11 (ref 9–20)
BUN: 11 mg/dL (ref 6–24)
Bilirubin Total: 0.5 mg/dL (ref 0.0–1.2)
CO2: 27 mmol/L (ref 20–29)
Calcium: 9.8 mg/dL (ref 8.7–10.2)
Chloride: 99 mmol/L (ref 96–106)
Creatinine, Ser: 1.01 mg/dL (ref 0.76–1.27)
GFR calc Af Amer: 94 mL/min/{1.73_m2} (ref >59)
GFR calc non Af Amer: 81 mL/min/{1.73_m2} (ref >59)
Globulin, Total: 2.5 g/dL (ref 1.5–4.5)
Glucose: 78 mg/dL (ref 65–99)
Potassium: 4.3 mmol/L (ref 3.5–5.2)
Sodium: 140 mmol/L (ref 134–144)
Total Protein: 6.8 g/dL (ref 6.0–8.5)

## 2017-11-24 LAB — PSA: Prostate Specific Ag, Serum: 2.6 ng/mL (ref 0.0–4.0)

## 2017-12-20 ENCOUNTER — Encounter: Payer: Self-pay | Admitting: Physician Assistant

## 2017-12-20 ENCOUNTER — Ambulatory Visit (INDEPENDENT_AMBULATORY_CARE_PROVIDER_SITE_OTHER): Payer: BLUE CROSS/BLUE SHIELD | Admitting: Physician Assistant

## 2017-12-20 VITALS — BP 134/76 | HR 69 | Temp 97.5°F | Ht 69.0 in | Wt 272.8 lb

## 2017-12-20 DIAGNOSIS — J0110 Acute frontal sinusitis, unspecified: Secondary | ICD-10-CM | POA: Diagnosis not present

## 2017-12-20 DIAGNOSIS — I10 Essential (primary) hypertension: Secondary | ICD-10-CM

## 2017-12-20 MED ORDER — SPIRONOLACTONE 25 MG PO TABS: 25.0000 mg | ORAL_TABLET | Freq: Every day | ORAL | 3 refills | Status: DC

## 2017-12-20 MED ORDER — AMOXICILLIN 500 MG PO CAPS: 500.0000 mg | ORAL_CAPSULE | Freq: Three times a day (TID) | ORAL | 0 refills | Status: DC

## 2017-12-21 LAB — CMP14+EGFR
ALT: 13 IU/L (ref 0–44)
AST: 23 IU/L (ref 0–40)
Albumin/Globulin Ratio: 1.5 (ref 1.2–2.2)
Albumin: 4.3 g/dL (ref 3.5–5.5)
Alkaline Phosphatase: 57 IU/L (ref 39–117)
BUN/Creatinine Ratio: 13 (ref 9–20)
BUN: 16 mg/dL (ref 6–24)
Bilirubin Total: 0.4 mg/dL (ref 0.0–1.2)
CO2: 23 mmol/L (ref 20–29)
Calcium: 9.8 mg/dL (ref 8.7–10.2)
Chloride: 101 mmol/L (ref 96–106)
Creatinine, Ser: 1.26 mg/dL (ref 0.76–1.27)
GFR calc Af Amer: 72 mL/min/{1.73_m2} (ref >59)
GFR calc non Af Amer: 62 mL/min/{1.73_m2} (ref >59)
Globulin, Total: 2.9 g/dL (ref 1.5–4.5)
Glucose: 65 mg/dL (ref 65–99)
Potassium: 5.3 mmol/L — ABNORMAL HIGH (ref 3.5–5.2)
Sodium: 140 mmol/L (ref 134–144)
Total Protein: 7.2 g/dL (ref 6.0–8.5)

## 2017-12-23 NOTE — Progress Notes (Signed)
BP 134/76   Pulse 69   Temp (!) 97.5 F (36.4 C) (Oral)   Ht _0  (1.753 m)   Wt 272 lb 12.8 oz (123.7 kg)   BMI 40.29 kg/m    Subjective:    Patient ID: Joel Nurse., male    DOB: 1960-06-27, 57 y.o.   MRN: [id number]  HPI: Joel Powell. is a 57 y.o. male presenting on 12/20/2017 for Hypertension (4 week follow up )  Rechecking on BP today, which is much better on the current regimen. He is tolerating the medications.    This patient has had many days of sinus headache and postnasal drainage. There is copious drainage at times. Denies any fever at this time. There has been a history of sinus infections in the past.  No history of sinus surgery. There is cough at night. It has become more prevalent in recent days.   Past Medical History:  Diagnosis Date  . Allergy   . Hepatitis A as child  . Hyperlipidemia   . Hypertension    Relevant past medical, surgical, family and social history reviewed and updated as indicated. Interim medical history since our last visit reviewed. Allergies and medications reviewed and updated. DATA REVIEWED: CHART IN EPIC  Family History reviewed for pertinent findings.  Review of Systems  Constitutional: Positive for fatigue. Negative for appetite change and fever.  HENT: Positive for congestion, sinus pressure, sinus pain and sore throat.   Eyes: Negative.  Negative for pain and visual disturbance.  Respiratory: Negative for cough, chest tightness, shortness of breath and wheezing.   Cardiovascular: Negative.  Negative for chest pain, palpitations and leg swelling.  Gastrointestinal: Negative.  Negative for abdominal pain, diarrhea, nausea and vomiting.  Endocrine: Negative.   Genitourinary: Negative.   Musculoskeletal: Negative for back pain and myalgias.  Skin: Negative.  Negative for color change and rash.  Neurological: Positive for headaches. Negative for weakness and numbness.  Psychiatric/Behavioral: Negative.     Allergies as  of 12/20/2017      Reactions   Clonidine Derivatives    edema   Norvasc [amlodipine Besylate]    edema      Medication List        Accurate as of 12/20/17 11:59 PM. Always use your most recent med list.          amoxicillin 500 MG capsule Commonly known as:  AMOXIL Take 1 capsule (500 mg total) by mouth 3 (three) times daily.   aspirin 81 MG tablet Take 81 mg by mouth daily.   cetirizine 10 MG tablet Commonly known as:  ZYRTEC Take 10 mg by mouth daily.   doxazosin 4 MG tablet Commonly known as:  CARDURA Take 1 tablet (4 mg total) by mouth daily.   fenofibrate micronized 134 MG capsule Commonly known as:  LOFIBRA Take 1 capsule (134 mg total) by mouth daily before breakfast.   metoprolol tartrate 50 MG tablet Commonly known as:  LOPRESSOR Take 1 tablet (50 mg total) by mouth 2 (two) times daily.   multivitamin tablet Take 1 tablet by mouth daily.   sildenafil 20 MG tablet Commonly known as:  REVATIO Take 1-5 tablets (20-100 mg total) by mouth daily.   simvastatin 20 MG tablet Commonly known as:  ZOCOR Take 1 tablet (20 mg total) by mouth daily.   spironolactone 25 MG tablet Commonly known as:  ALDACTONE Take 1 tablet (25 mg total) by mouth daily.   triamcinolone cream 0.5 % Commonly known  as:  KENALOG Apply 1 application topically 3 (three) times daily.          Objective:    BP 134/76   Pulse 69   Temp (!) 97.5 F (36.4 C) (Oral)   Ht _0  (1.753 m)   Wt 272 lb 12.8 oz (123.7 kg)   BMI 40.29 kg/m   Allergies  Allergen Reactions  . Clonidine Derivatives     edema  . Norvasc [Amlodipine Besylate]     edema    Wt Readings from Last 3 Encounters:  12/20/17 272 lb 12.8 oz (123.7 kg)  11/20/17 273 lb 6.4 oz (124 kg)  05/21/17 265 lb (120.2 kg)    Physical Exam  Constitutional: He is oriented to person, place, and time. He appears well-developed and well-nourished.  HENT:  Head: Normocephalic and atraumatic.  Right Ear: Tympanic  membrane and external ear normal. No middle ear effusion.  Left Ear: Tympanic membrane and external ear normal.  No middle ear effusion.  Nose: Mucosal edema and rhinorrhea present. Right sinus exhibits no maxillary sinus tenderness. Left sinus exhibits no maxillary sinus tenderness.  Mouth/Throat: Uvula is midline. Posterior oropharyngeal erythema present.  Eyes: Pupils are equal, round, and reactive to light. Conjunctivae and EOM are normal. Right eye exhibits no discharge. Left eye exhibits no discharge.  Neck: Normal range of motion.  Cardiovascular: Normal rate, regular rhythm and normal heart sounds.  Pulmonary/Chest: Effort normal and breath sounds normal. No respiratory distress. He has no wheezes.  Abdominal: Soft.  Lymphadenopathy:    He has no cervical adenopathy.  Neurological: He is alert and oriented to person, place, and time.  Skin: Skin is warm and dry.  Psychiatric: He has a normal mood and affect.    Results for orders placed or performed in visit on 12/20/17  CMP14+EGFR  Result Value Ref Range   Glucose 65 65 - 99 mg/dL   BUN 16 6 - 24 mg/dL   Creatinine, Ser 1.26 0.76 - 1.27 mg/dL   GFR calc non Af Amer 62 >59 mL/min/1.73   GFR calc Af Amer 72 >59 mL/min/1.73   BUN/Creatinine Ratio 13 9 - 20   Sodium 140 134 - 144 mmol/L   Potassium 5.3 (H) 3.5 - 5.2 mmol/L   Chloride 101 96 - 106 mmol/L   CO2 23 20 - 29 mmol/L   Calcium 9.8 8.7 - 10.2 mg/dL   Total Protein 7.2 6.0 - 8.5 g/dL   Albumin 4.3 3.5 - 5.5 g/dL   Globulin, Total 2.9 1.5 - 4.5 g/dL   Albumin/Globulin Ratio 1.5 1.2 - 2.2   Bilirubin Total 0.4 0.0 - 1.2 mg/dL   Alkaline Phosphatase 57 39 - 117 IU/L   AST 23 0 - 40 IU/L   ALT 13 0 - 44 IU/L      Assessment & Plan:   1. Essential hypertension - spironolactone (ALDACTONE) 25 MG tablet; Take 1 tablet (25 mg total) by mouth daily.  Dispense: 90 tablet; Refill: 3 - CMP14+EGFR  2. Acute non-recurrent frontal sinusitis - amoxicillin (AMOXIL) 500 MG  capsule; Take 1 capsule (500 mg total) by mouth 3 (three) times daily.  Dispense: 30 capsule; Refill: 0   Continue all other maintenance medications as listed above.  Follow up plan: Return in about 6 months (around 06/21/2018) for check.  Educational handout given for [place] PA-C [place] [address]  [place], [place] [address] [phone]   12/23/2017, 9:03 PM

## 2018-01-03 ENCOUNTER — Other Ambulatory Visit: Payer: Self-pay | Admitting: Physician Assistant

## 2018-01-03 DIAGNOSIS — E782 Mixed hyperlipidemia: Secondary | ICD-10-CM

## 2018-01-26 ENCOUNTER — Other Ambulatory Visit: Payer: Self-pay | Admitting: Physician Assistant

## 2018-01-26 DIAGNOSIS — E782 Mixed hyperlipidemia: Secondary | ICD-10-CM

## 2018-02-09 ENCOUNTER — Other Ambulatory Visit: Payer: Self-pay | Admitting: Physician Assistant

## 2018-02-09 DIAGNOSIS — I10 Essential (primary) hypertension: Secondary | ICD-10-CM

## 2018-02-11 ENCOUNTER — Other Ambulatory Visit: Payer: Self-pay | Admitting: Physician Assistant

## 2018-02-11 DIAGNOSIS — E782 Mixed hyperlipidemia: Secondary | ICD-10-CM

## 2018-04-08 ENCOUNTER — Other Ambulatory Visit: Payer: Self-pay | Admitting: Physician Assistant

## 2018-04-08 DIAGNOSIS — I10 Essential (primary) hypertension: Secondary | ICD-10-CM

## 2018-04-08 NOTE — Telephone Encounter (Signed)
Rx was sent in in November #180 w/ 3 RFs to [facility] Pt is aware & is ok with this

## 2018-04-08 NOTE — Telephone Encounter (Signed)
What is the name of the medication? metoprolol tartrate (LOPRESSOR) 50 MG tablet  Have you contacted your pharmacy to request a refill? [facility]  Which pharmacy would you like this sent to? yes   Patient notified that their request is being sent to the clinical staff for review and that they should receive a call once it is complete. If they do not receive a call within 24 hours they can check with their pharmacy or our office.

## 2018-04-09 ENCOUNTER — Telehealth: Payer: Self-pay | Admitting: Physician Assistant

## 2018-04-09 DIAGNOSIS — I10 Essential (primary) hypertension: Secondary | ICD-10-CM

## 2018-04-09 MED ORDER — METOPROLOL TARTRATE 50 MG PO TABS: 50.0000 mg | ORAL_TABLET | Freq: Two times a day (BID) | ORAL | 0 refills | Status: DC

## 2018-04-09 NOTE — Telephone Encounter (Signed)
Sent in, pt aware

## 2018-05-06 ENCOUNTER — Other Ambulatory Visit: Payer: Self-pay | Admitting: Physician Assistant

## 2018-05-06 DIAGNOSIS — E782 Mixed hyperlipidemia: Secondary | ICD-10-CM

## 2018-05-13 ENCOUNTER — Other Ambulatory Visit: Payer: Self-pay | Admitting: Physician Assistant

## 2018-05-13 DIAGNOSIS — E782 Mixed hyperlipidemia: Secondary | ICD-10-CM

## 2018-06-20 ENCOUNTER — Other Ambulatory Visit: Payer: Self-pay

## 2018-06-21 ENCOUNTER — Ambulatory Visit (INDEPENDENT_AMBULATORY_CARE_PROVIDER_SITE_OTHER): Payer: BC Managed Care – PPO | Admitting: Physician Assistant

## 2018-06-21 ENCOUNTER — Encounter: Payer: Self-pay | Admitting: Physician Assistant

## 2018-06-21 DIAGNOSIS — I10 Essential (primary) hypertension: Secondary | ICD-10-CM | POA: Diagnosis not present

## 2018-06-21 DIAGNOSIS — E782 Mixed hyperlipidemia: Secondary | ICD-10-CM | POA: Diagnosis not present

## 2018-06-21 LAB — CMP14+EGFR
ALT: 11 IU/L (ref 0–44)
AST: 18 IU/L (ref 0–40)
Albumin/Globulin Ratio: 1.8 (ref 1.2–2.2)
Albumin: 4.1 g/dL (ref 3.8–4.9)
Alkaline Phosphatase: 53 IU/L (ref 39–117)
BUN/Creatinine Ratio: 14 (ref 10–24)
BUN: 12 mg/dL (ref 8–27)
Bilirubin Total: 0.3 mg/dL (ref 0.0–1.2)
CO2: 21 mmol/L (ref 20–29)
Calcium: 9 mg/dL (ref 8.6–10.2)
Chloride: 99 mmol/L (ref 96–106)
Creatinine, Ser: 0.87 mg/dL (ref 0.76–1.27)
GFR calc Af Amer: 108 mL/min/{1.73_m2} (ref >59)
GFR calc non Af Amer: 94 mL/min/{1.73_m2} (ref >59)
Globulin, Total: 2.3 g/dL (ref 1.5–4.5)
Glucose: 87 mg/dL (ref 65–99)
Potassium: 4.8 mmol/L (ref 3.5–5.2)
Sodium: 134 mmol/L (ref 134–144)
Total Protein: 6.4 g/dL (ref 6.0–8.5)

## 2018-06-21 MED ORDER — FENOFIBRATE MICRONIZED 134 MG PO CAPS: 134.0000 mg | ORAL_CAPSULE | Freq: Every day | ORAL | 3 refills | Status: AC

## 2018-06-21 MED ORDER — SIMVASTATIN 20 MG PO TABS: 20.0000 mg | ORAL_TABLET | Freq: Every day | ORAL | 3 refills | Status: AC

## 2018-06-21 MED ORDER — METOPROLOL TARTRATE 50 MG PO TABS: 50.0000 mg | ORAL_TABLET | Freq: Two times a day (BID) | ORAL | 3 refills | Status: DC

## 2018-06-21 MED ORDER — DOXAZOSIN MESYLATE 4 MG PO TABS: 4.0000 mg | ORAL_TABLET | Freq: Every day | ORAL | 3 refills | Status: AC

## 2018-06-21 MED ORDER — SPIRONOLACTONE 50 MG PO TABS: 50.0000 mg | ORAL_TABLET | Freq: Every day | ORAL | 1 refills | Status: DC

## 2018-06-24 ENCOUNTER — Telehealth: Payer: Self-pay | Admitting: Physician Assistant

## 2018-06-24 NOTE — Telephone Encounter (Signed)
PT is needing to speak to nurse about spironolactone (ALDACTONE) 50 MG tablet [[id number]]   needs clairfication on how to take it due to he was only given the 25 mg??? And was changed to 50 mg

## 2018-06-24 NOTE — Telephone Encounter (Signed)
Patient aware that 50mg  tables have been sent to the pharmacy

## 2018-06-25 NOTE — Progress Notes (Signed)
BP (!) 141/83   Pulse 68   Temp 97.9 F (36.6 C) (Oral)   Ht '5\' 9"'  (1.753 m)   Wt 274 lb 9.6 oz (124.6 kg)   BMI 40.55 kg/m    Subjective:    Patient ID: [name], male    DOB: May 01, 1960, 58 y.o.   MRN: [id number]  HPI: Joel Powell. is a 58 y.o. male presenting on 06/21/2018 for Medical Management of Chronic Issues (six month recheck)  Patient comes in for periodic recheck on his chronic medical conditions which do include hypertension and hyperlipidemia.  He was started on spironolactone 25 mg 1 daily at the last visit.  He states he is tolerating it very well.  He has not noticed any severe increase in his need for urination.  He has not had any muscle cramping or weakness.  He has been able to continue work during COVID-19.  He has been a lot less busy.  We will have labs drawn today to check on renal functions.  We will send refills and for his medications and will plan to see him back in a few months.  Past Medical History:  Diagnosis Date  . Allergy   . Hepatitis A as child  . Hyperlipidemia   . Hypertension    Relevant past medical, surgical, family and social history reviewed and updated as indicated. Interim medical history since our last visit reviewed. Allergies and medications reviewed and updated. DATA REVIEWED: CHART IN EPIC  Family History reviewed for pertinent findings.  Review of Systems  Constitutional: Negative.  Negative for appetite change and fatigue.  Eyes: Negative for pain and visual disturbance.  Respiratory: Negative.  Negative for cough, chest tightness, shortness of breath and wheezing.   Cardiovascular: Negative.  Negative for chest pain, palpitations and leg swelling.  Gastrointestinal: Negative.  Negative for abdominal pain, diarrhea, nausea and vomiting.  Genitourinary: Negative.   Skin: Negative.  Negative for color change and rash.  Neurological: Negative.  Negative for weakness, numbness and headaches.  Psychiatric/Behavioral:  Negative.     Allergies as of 06/21/2018      Reactions   Clonidine Derivatives    edema   Norvasc [amlodipine Besylate]    edema      Medication List       Accurate as of June 21, 2018 11:59 PM. If you have any questions, ask your nurse or doctor.        STOP taking these medications   amoxicillin 500 MG capsule Commonly known as:  AMOXIL Stopped by:  Terald Sleeper, PA-C   cetirizine 10 MG tablet Commonly known as:  ZYRTEC Stopped by:  Terald Sleeper, PA-C   triamcinolone cream 0.5 % Commonly known as:  KENALOG Stopped by:  Terald Sleeper, PA-C     TAKE these medications   aspirin 81 MG tablet Take 81 mg by mouth daily.   doxazosin 4 MG tablet Commonly known as:  CARDURA Take 1 tablet (4 mg total) by mouth daily.   fenofibrate micronized 134 MG capsule Commonly known as:  LOFIBRA Take 1 capsule (134 mg total) by mouth daily before breakfast.   metoprolol tartrate 50 MG tablet Commonly known as:  LOPRESSOR Take 1 tablet (50 mg total) by mouth 2 (two) times daily.   multivitamin tablet Take 1 tablet by mouth daily.   sildenafil 20 MG tablet Commonly known as:  REVATIO Take 1-5 tablets (20-100 mg total) by mouth daily.   simvastatin 20 MG  tablet Commonly known as:  ZOCOR Take 1 tablet (20 mg total) by mouth daily.   spironolactone 50 MG tablet Commonly known as:  ALDACTONE Take 1 tablet (50 mg total) by mouth daily. What changed:    medication strength  how much to take Changed by:  Terald Sleeper, PA-C          Objective:    BP (!) 141/83   Pulse 68   Temp 97.9 F (36.6 C) (Oral)   Ht '5\' 9"'  (1.753 m)   Wt 274 lb 9.6 oz (124.6 kg)   BMI 40.55 kg/m   Allergies  Allergen Reactions  . Clonidine Derivatives     edema  . Norvasc [Amlodipine Besylate]     edema    Wt Readings from Last 3 Encounters:  06/21/18 274 lb 9.6 oz (124.6 kg)  12/20/17 272 lb 12.8 oz (123.7 kg)  11/20/17 273 lb 6.4 oz (124 kg)    Physical Exam Vitals signs and  nursing note reviewed.  Constitutional:      General: He is not in acute distress.    Appearance: He is well-developed.  HENT:     Head: Normocephalic and atraumatic.  Eyes:     Conjunctiva/sclera: Conjunctivae normal.     Pupils: Pupils are equal, round, and reactive to light.  Cardiovascular:     Rate and Rhythm: Normal rate and regular rhythm.     Heart sounds: Normal heart sounds.  Pulmonary:     Effort: Pulmonary effort is normal. No respiratory distress.     Breath sounds: Normal breath sounds.  Skin:    General: Skin is warm and dry.  Psychiatric:        Behavior: Behavior normal.     Results for orders placed or performed in visit on 06/21/18  CMP14+EGFR  Result Value Ref Range   Glucose 87 65 - 99 mg/dL   BUN 12 8 - 27 mg/dL   Creatinine, Ser 0.87 0.76 - 1.27 mg/dL   GFR calc non Af Amer 94 >59 mL/min/1.73   GFR calc Af Amer 108 >59 mL/min/1.73   BUN/Creatinine Ratio 14 10 - 24   Sodium 134 134 - 144 mmol/L   Potassium 4.8 3.5 - 5.2 mmol/L   Chloride 99 96 - 106 mmol/L   CO2 21 20 - 29 mmol/L   Calcium 9.0 8.6 - 10.2 mg/dL   Total Protein 6.4 6.0 - 8.5 g/dL   Albumin 4.1 3.8 - 4.9 g/dL   Globulin, Total 2.3 1.5 - 4.5 g/dL   Albumin/Globulin Ratio 1.8 1.2 - 2.2   Bilirubin Total 0.3 0.0 - 1.2 mg/dL   Alkaline Phosphatase 53 39 - 117 IU/L   AST 18 0 - 40 IU/L   ALT 11 0 - 44 IU/L      Assessment & Plan:   1. Essential hypertension - spironolactone (ALDACTONE) 50 MG tablet; Take 1 tablet (50 mg total) by mouth daily.  Dispense: 90 tablet; Refill: 1 - metoprolol tartrate (LOPRESSOR) 50 MG tablet; Take 1 tablet (50 mg total) by mouth 2 (two) times daily.  Dispense: 180 tablet; Refill: 3 - doxazosin (CARDURA) 4 MG tablet; Take 1 tablet (4 mg total) by mouth daily.  Dispense: 90 tablet; Refill: 3 - CMP14+EGFR  2. Mixed hyperlipidemia - simvastatin (ZOCOR) 20 MG tablet; Take 1 tablet (20 mg total) by mouth daily.  Dispense: 90 tablet; Refill: 3 - fenofibrate  micronized (LOFIBRA) 134 MG capsule; Take 1 capsule (134 mg total) by mouth daily  before breakfast.  Dispense: 90 capsule; Refill: 3   Continue all other maintenance medications as listed above.  Follow up plan: Return in about 2 months (around 08/21/2018).  Educational handout given for [place] PA-C [place] [address]  [place], [place] [address] [phone]   06/25/2018, 5:01 PM

## 2018-08-27 ENCOUNTER — Ambulatory Visit: Payer: BC Managed Care – PPO | Admitting: Physician Assistant

## 2018-09-03 ENCOUNTER — Encounter: Payer: Self-pay | Admitting: Physician Assistant

## 2018-09-03 ENCOUNTER — Ambulatory Visit (INDEPENDENT_AMBULATORY_CARE_PROVIDER_SITE_OTHER): Payer: BC Managed Care – PPO | Admitting: Physician Assistant

## 2018-09-03 DIAGNOSIS — I10 Essential (primary) hypertension: Secondary | ICD-10-CM

## 2018-09-03 MED ORDER — SPIRONOLACTONE 50 MG PO TABS: 50.0000 mg | ORAL_TABLET | Freq: Every day | ORAL | 3 refills | Status: DC

## 2018-09-03 NOTE — Progress Notes (Signed)
      Telephone visit  Subjective: CC: Hypertension PCP: Terald Sleeper, PA-C HPI:Joel Powell. is a 58 y.o. male calls for telephone consult today. Patient provides verbal consent for consult held via phone.  Patient is identified with 2 separate identifiers.  At this time the entire area is on COVID-19 social distancing and stay home orders are in place.  Patient is of higher risk and therefore we are performing this by a virtual method.  Location of patient: Work Location of provider: HOME Others present for call: No  This patient is having a recheck on his hypertension.  He reports that his blood pressure readings have been in the 130s over 60.  Today it was 134/62.  He is down 10 pounds with diligent diet and exercise efforts.  He does need refills on the Spironolactone.  He states that he is up-to-date on all of his other medications.  He denies having any other issues at this time.   ROS: Per HPI  Allergies  Allergen Reactions  . Clonidine Derivatives     edema  . Norvasc [Amlodipine Besylate]     edema   Past Medical History:  Diagnosis Date  . Allergy   . Hepatitis A as child  . Hyperlipidemia   . Hypertension     Current Outpatient Medications:  .  aspirin 81 MG tablet, Take 81 mg by mouth daily., Disp: , Rfl:  .  doxazosin (CARDURA) 4 MG tablet, Take 1 tablet (4 mg total) by mouth daily., Disp: 90 tablet, Rfl: 3 .  fenofibrate micronized (LOFIBRA) 134 MG capsule, Take 1 capsule (134 mg total) by mouth daily before breakfast., Disp: 90 capsule, Rfl: 3 .  metoprolol tartrate (LOPRESSOR) 50 MG tablet, Take 1 tablet (50 mg total) by mouth 2 (two) times daily., Disp: 180 tablet, Rfl: 3 .  Multiple Vitamin (MULTIVITAMIN) tablet, Take 1 tablet by mouth daily., Disp: , Rfl:  .  sildenafil (REVATIO) 20 MG tablet, Take 1-5 tablets (20-100 mg total) by mouth daily., Disp: 30 tablet, Rfl: 5 .  simvastatin (ZOCOR) 20 MG tablet, Take 1 tablet (20 mg total) by mouth daily.,  Disp: 90 tablet, Rfl: 3 .  spironolactone (ALDACTONE) 50 MG tablet, Take 1 tablet (50 mg total) by mouth daily., Disp: 90 tablet, Rfl: 3  Assessment/ Plan: 58 y.o. male   1. Essential hypertension - spironolactone (ALDACTONE) 50 MG tablet; Take 1 tablet (50 mg total) by mouth daily.  Dispense: 90 tablet; Refill: 3   Return in about 3 months (around 11/27/2018) for well exam.  Continue all other maintenance medications as listed above.  Start time: 11:25 AM End time: 11:31 AM  Meds ordered this encounter  Medications  . spironolactone (ALDACTONE) 50 MG tablet    Sig: Take 1 tablet (50 mg total) by mouth daily.    Dispense:  90 tablet    Refill:  3    Order Specific Question:   Supervising Provider    Answer:   Terald Sleeper [[id number]]    Particia Nearing PA-C [place] [phone]

## 2018-12-19 ENCOUNTER — Other Ambulatory Visit: Payer: Self-pay

## 2018-12-20 ENCOUNTER — Other Ambulatory Visit: Payer: BC Managed Care – PPO

## 2018-12-25 ENCOUNTER — Other Ambulatory Visit: Payer: Self-pay | Admitting: Physician Assistant

## 2018-12-25 DIAGNOSIS — I10 Essential (primary) hypertension: Secondary | ICD-10-CM

## 2019-01-23 ENCOUNTER — Other Ambulatory Visit: Payer: Self-pay

## 2019-01-24 ENCOUNTER — Encounter: Payer: Self-pay | Admitting: Physician Assistant

## 2019-01-24 ENCOUNTER — Ambulatory Visit (INDEPENDENT_AMBULATORY_CARE_PROVIDER_SITE_OTHER): Payer: BC Managed Care – PPO | Admitting: Physician Assistant

## 2019-01-24 VITALS — BP 133/74 | HR 95 | Temp 97.1°F | Ht 69.0 in | Wt 264.8 lb

## 2019-01-24 DIAGNOSIS — E782 Mixed hyperlipidemia: Secondary | ICD-10-CM | POA: Diagnosis not present

## 2019-01-24 DIAGNOSIS — Z0000 Encounter for general adult medical examination without abnormal findings: Secondary | ICD-10-CM | POA: Diagnosis not present

## 2019-01-24 DIAGNOSIS — I10 Essential (primary) hypertension: Secondary | ICD-10-CM

## 2019-01-24 NOTE — Patient Instructions (Signed)
DASH Eating Plan DASH stands for "Dietary Approaches to Stop Hypertension." The DASH eating plan is a healthy eating plan that has been shown to reduce high blood pressure (hypertension). It may also reduce your risk for type 2 diabetes, heart disease, and stroke. The DASH eating plan may also help with weight loss. What are tips for following this plan?  General guidelines  Avoid eating more than 2,300 mg (milligrams) of salt (sodium) a day. If you have hypertension, you may need to reduce your sodium intake to 1,500 mg a day.  Limit alcohol intake to no more than 1 drink a day for nonpregnant women and 2 drinks a day for men. One drink equals 12 oz of beer, 5 oz of wine, or 1 oz of hard liquor.  Work with your health care provider to maintain a healthy body weight or to lose weight. Ask what an ideal weight is for you.  Get at least 30 minutes of exercise that causes your heart to beat faster (aerobic exercise) most days of the week. Activities may include walking, swimming, or biking.  Work with your health care provider or diet and nutrition specialist (dietitian) to adjust your eating plan to your individual calorie needs. Reading food labels   Check food labels for the amount of sodium per serving. Choose foods with less than 5 percent of the Daily Value of sodium. Generally, foods with less than 300 mg of sodium per serving fit into this eating plan.  To find whole grains, look for the word "whole" as the first word in the ingredient list. Shopping  Buy products labeled as "low-sodium" or "no salt added."  Buy fresh foods. Avoid canned foods and premade or frozen meals. Cooking  Avoid adding salt when cooking. Use salt-free seasonings or herbs instead of table salt or sea salt. Check with your health care provider or pharmacist before using salt substitutes.  Do not fry foods. Cook foods using healthy methods such as baking, boiling, grilling, and broiling instead.  Cook with  heart-healthy oils, such as olive, canola, soybean, or sunflower oil. Meal planning  Eat a balanced diet that includes: ? 5 or more servings of fruits and vegetables each day. At each meal, try to fill half of your plate with fruits and vegetables. ? Up to 6-8 servings of whole grains each day. ? Less than 6 oz of lean meat, poultry, or fish each day. A 3-oz serving of meat is about the same size as a deck of cards. One egg equals 1 oz. ? 2 servings of low-fat dairy each day. ? A serving of nuts, seeds, or beans 5 times each week. ? Heart-healthy fats. Healthy fats called Omega-3 fatty acids are found in foods such as flaxseeds and coldwater fish, like sardines, salmon, and mackerel.  Limit how much you eat of the following: ? Canned or prepackaged foods. ? Food that is high in trans fat, such as fried foods. ? Food that is high in saturated fat, such as fatty meat. ? Sweets, desserts, sugary drinks, and other foods with added sugar. ? Full-fat dairy products.  Do not salt foods before eating.  Try to eat at least 2 vegetarian meals each week.  Eat more home-cooked food and less restaurant, buffet, and fast food.  When eating at a restaurant, ask that your food be prepared with less salt or no salt, if possible. What foods are recommended? The items listed may not be a complete list. Talk with your dietitian about   what dietary choices are best for you. Grains Whole-grain or whole-wheat bread. Whole-grain or whole-wheat pasta. Brown rice. Oatmeal. Quinoa. Bulgur. Whole-grain and low-sodium cereals. Pita bread. Low-fat, low-sodium crackers. Whole-wheat flour tortillas. Vegetables Fresh or frozen vegetables (raw, steamed, roasted, or grilled). Low-sodium or reduced-sodium tomato and vegetable juice. Low-sodium or reduced-sodium tomato sauce and tomato paste. Low-sodium or reduced-sodium canned vegetables. Fruits All fresh, dried, or frozen fruit. Canned fruit in natural juice (without  added sugar). Meat and other protein foods Skinless chicken or turkey. Ground chicken or turkey. Pork with fat trimmed off. Fish and seafood. Egg whites. Dried beans, peas, or lentils. Unsalted nuts, nut butters, and seeds. Unsalted canned beans. Lean cuts of beef with fat trimmed off. Low-sodium, lean deli meat. Dairy Low-fat (1%) or fat-free (skim) milk. Fat-free, low-fat, or reduced-fat cheeses. Nonfat, low-sodium ricotta or cottage cheese. Low-fat or nonfat yogurt. Low-fat, low-sodium cheese. Fats and oils Soft margarine without trans fats. Vegetable oil. Low-fat, reduced-fat, or light mayonnaise and salad dressings (reduced-sodium). Canola, safflower, olive, soybean, and sunflower oils. Avocado. Seasoning and other foods Herbs. Spices. Seasoning mixes without salt. Unsalted popcorn and pretzels. Fat-free sweets. What foods are not recommended? The items listed may not be a complete list. Talk with your dietitian about what dietary choices are best for you. Grains Baked goods made with fat, such as croissants, muffins, or some breads. Dry pasta or rice meal packs. Vegetables Creamed or fried vegetables. Vegetables in a cheese sauce. Regular canned vegetables (not low-sodium or reduced-sodium). Regular canned tomato sauce and paste (not low-sodium or reduced-sodium). Regular tomato and vegetable juice (not low-sodium or reduced-sodium). Pickles. Olives. Fruits Canned fruit in a light or heavy syrup. Fried fruit. Fruit in cream or butter sauce. Meat and other protein foods Fatty cuts of meat. Ribs. Fried meat. Bacon. Sausage. Bologna and other processed lunch meats. Salami. Fatback. Hotdogs. Bratwurst. Salted nuts and seeds. Canned beans with added salt. Canned or smoked fish. Whole eggs or egg yolks. Chicken or turkey with skin. Dairy Whole or 2% milk, cream, and half-and-half. Whole or full-fat cream cheese. Whole-fat or sweetened yogurt. Full-fat cheese. Nondairy creamers. Whipped toppings.  Processed cheese and cheese spreads. Fats and oils Butter. Stick margarine. Lard. Shortening. Ghee. Bacon fat. Tropical oils, such as coconut, palm kernel, or palm oil. Seasoning and other foods Salted popcorn and pretzels. Onion salt, garlic salt, seasoned salt, table salt, and sea salt. Worcestershire sauce. Tartar sauce. Barbecue sauce. Teriyaki sauce. Soy sauce, including reduced-sodium. Steak sauce. Canned and packaged gravies. Fish sauce. Oyster sauce. Cocktail sauce. Horseradish that you find on the shelf. Ketchup. Mustard. Meat flavorings and tenderizers. Bouillon cubes. Hot sauce and Tabasco sauce. Premade or packaged marinades. Premade or packaged taco seasonings. Relishes. Regular salad dressings. Where to find more information:  National Heart, Lung, and Blood Institute: www.nhlbi.nih.gov  American Heart Association: www.heart.org Summary  The DASH eating plan is a healthy eating plan that has been shown to reduce high blood pressure (hypertension). It may also reduce your risk for type 2 diabetes, heart disease, and stroke.  With the DASH eating plan, you should limit salt (sodium) intake to 2,300 mg a day. If you have hypertension, you may need to reduce your sodium intake to 1,500 mg a day.  When on the DASH eating plan, aim to eat more fresh fruits and vegetables, whole grains, lean proteins, low-fat dairy, and heart-healthy fats.  Work with your health care provider or diet and nutrition specialist (dietitian) to adjust your eating plan to your   individual calorie needs. This information is not intended to replace advice given to you by your health care provider. Make sure you discuss any questions you have with your health care provider. Document Revised: 12/15/2016 Document Reviewed: 12/27/2015 Elsevier Patient Education  2020 [employer]  

## 2019-01-25 LAB — CBC WITH DIFFERENTIAL/PLATELET
Basophils Absolute: 0.2 10*3/uL (ref 0.0–0.2)
Basos: 2 %
EOS (ABSOLUTE): 0.4 10*3/uL (ref 0.0–0.4)
Eos: 4 %
Hematocrit: 41 % (ref 37.5–51.0)
Hemoglobin: 13.9 g/dL (ref 13.0–17.7)
Immature Grans (Abs): 0.1 10*3/uL (ref 0.0–0.1)
Immature Granulocytes: 1 %
Lymphocytes Absolute: 1.8 10*3/uL (ref 0.7–3.1)
Lymphs: 18 %
MCH: 32.3 pg (ref 26.6–33.0)
MCHC: 33.9 g/dL (ref 31.5–35.7)
MCV: 95 fL (ref 79–97)
Monocytes Absolute: 0.8 10*3/uL (ref 0.1–0.9)
Monocytes: 8 %
Neutrophils Absolute: 7 10*3/uL (ref 1.4–7.0)
Neutrophils: 67 %
Platelets: 340 10*3/uL (ref 150–450)
RBC: 4.3 x10E6/uL (ref 4.14–5.80)
RDW: 11.6 % (ref 11.6–15.4)
WBC: 10.2 10*3/uL (ref 3.4–10.8)

## 2019-01-25 LAB — CMP14+EGFR
ALT: 13 IU/L (ref 0–44)
AST: 24 IU/L (ref 0–40)
Albumin/Globulin Ratio: 1.3 (ref 1.2–2.2)
Albumin: 3.9 g/dL (ref 3.8–4.9)
Alkaline Phosphatase: 61 IU/L (ref 39–117)
BUN/Creatinine Ratio: 11 (ref 10–24)
BUN: 11 mg/dL (ref 8–27)
Bilirubin Total: 0.3 mg/dL (ref 0.0–1.2)
CO2: 20 mmol/L (ref 20–29)
Calcium: 9 mg/dL (ref 8.6–10.2)
Chloride: 100 mmol/L (ref 96–106)
Creatinine, Ser: 1 mg/dL (ref 0.76–1.27)
GFR calc Af Amer: 94 mL/min/{1.73_m2} (ref >59)
GFR calc non Af Amer: 81 mL/min/{1.73_m2} (ref >59)
Globulin, Total: 3.1 g/dL (ref 1.5–4.5)
Glucose: 88 mg/dL (ref 65–99)
Potassium: 4.8 mmol/L (ref 3.5–5.2)
Sodium: 135 mmol/L (ref 134–144)
Total Protein: 7 g/dL (ref 6.0–8.5)

## 2019-01-25 LAB — PSA: Prostate Specific Ag, Serum: 2.1 ng/mL (ref 0.0–4.0)

## 2019-01-25 LAB — TSH: TSH: 1.49 u[IU]/mL (ref 0.450–4.500)

## 2019-01-25 LAB — LIPID PANEL
Chol/HDL Ratio: 2.7 ratio (ref 0.0–5.0)
Cholesterol, Total: 122 mg/dL (ref 100–199)
HDL: 46 mg/dL (ref >39)
LDL Chol Calc (NIH): 59 mg/dL (ref 0–99)
Triglycerides: 91 mg/dL (ref 0–149)
VLDL Cholesterol Cal: 17 mg/dL (ref 5–40)

## 2019-01-26 NOTE — Progress Notes (Signed)
Acute Office Visit  Subjective:    Patient ID: Joel Powell., male    DOB: December 30, 1960, 59 y.o.   MRN: [id number]  Chief Complaint  Patient presents with  . Hypertension    Hypertension This is a chronic problem. The current episode started more than 1 year ago. The problem is controlled. Pertinent negatives include no chest pain, headaches, palpitations or shortness of breath. There are no associated agents to hypertension. Risk factors for coronary artery disease include dyslipidemia, obesity and male gender. Past treatments include alpha 1 blockers, beta blockers and diuretics. The current treatment provides significant improvement. There are no compliance problems.  There is no history of angina. There is no history of chronic renal disease.  Hyperlipidemia This is a chronic problem. The current episode started more than 1 year ago. The problem is controlled. He has no history of chronic renal disease. There are no known factors aggravating his hyperlipidemia. Pertinent negatives include no chest pain or shortness of breath. Current antihyperlipidemic treatment includes statins. The current treatment provides significant improvement of lipids. There are no compliance problems.      Past Medical History:  Diagnosis Date  . Allergy   . Hepatitis A as child  . Hyperlipidemia   . Hypertension     History reviewed. No pertinent surgical history.  Family History  Problem Relation Age of Onset  . Kidney disease Father   . Liver cancer Father   . Colon cancer Neg Hx     Social History   Socioeconomic History  . Marital status: Married    Spouse name: Not on file  . Number of children: Not on file  . Years of education: Not on file  . Highest education level: Not on file  Occupational History  . Not on file  Tobacco Use  . Smoking status: Never Smoker  . Smokeless tobacco: Never Used  Substance and Sexual Activity  . Alcohol use: Yes    Alcohol/week: 1.0 standard drinks     Types: 1 Standard drinks or equivalent per week    Comment: occasional  . Drug use: No  . Sexual activity: Not on file  Other Topics Concern  . Not on file  Social History Narrative  . Not on file   Social Determinants of Health   Financial Resource Strain:   . Difficulty of Paying Living Expenses: Not on file  Food Insecurity:   . Worried About Charity fundraiser in the Last Year: Not on file  . Ran Out of Food in the Last Year: Not on file  Transportation Needs:   . Lack of Transportation (Medical): Not on file  . Lack of Transportation (Non-Medical): Not on file  Physical Activity:   . Days of Exercise per Week: Not on file  . Minutes of Exercise per Session: Not on file  Stress:   . Feeling of Stress : Not on file  Social Connections:   . Frequency of Communication with Friends and Family: Not on file  . Frequency of Social Gatherings with Friends and Family: Not on file  . Attends Religious Services: Not on file  . Active Member of Clubs or Organizations: Not on file  . Attends Archivist Meetings: Not on file  . Marital Status: Not on file  Intimate Partner Violence:   . Fear of Current or Ex-Partner: Not on file  . Emotionally Abused: Not on file  . Physically Abused: Not on file  . Sexually Abused: Not on  file    Outpatient Medications Prior to Visit  Medication Sig Dispense Refill  . aspirin 81 MG tablet Take 81 mg by mouth daily.    Marland Kitchen doxazosin (CARDURA) 4 MG tablet Take 1 tablet (4 mg total) by mouth daily. 90 tablet 3  . fenofibrate micronized (LOFIBRA) 134 MG capsule Take 1 capsule (134 mg total) by mouth daily before breakfast. 90 capsule 3  . metoprolol tartrate (LOPRESSOR) 50 MG tablet Take 1 tablet (50 mg total) by mouth 2 (two) times daily. 180 tablet 3  . Multiple Vitamin (MULTIVITAMIN) tablet Take 1 tablet by mouth daily.    . sildenafil (REVATIO) 20 MG tablet Take 1-5 tablets (20-100 mg total) by mouth daily. 30 tablet 5  . simvastatin  (ZOCOR) 20 MG tablet Take 1 tablet (20 mg total) by mouth daily. 90 tablet 3  . spironolactone (ALDACTONE) 50 MG tablet Take 1 tablet (50 mg total) by mouth daily. 90 tablet 3   No facility-administered medications prior to visit.    Allergies  Allergen Reactions  . Clonidine Derivatives     edema  . Norvasc [Amlodipine Besylate]     edema    Review of Systems  Constitutional: Negative.  Negative for appetite change and fatigue.  Eyes: Negative for pain and visual disturbance.  Respiratory: Negative.  Negative for cough, chest tightness, shortness of breath and wheezing.   Cardiovascular: Negative.  Negative for chest pain, palpitations and leg swelling.  Gastrointestinal: Negative.  Negative for abdominal pain, diarrhea, nausea and vomiting.  Genitourinary: Negative.   Skin: Negative.  Negative for color change and rash.  Neurological: Negative.  Negative for weakness, numbness and headaches.  Psychiatric/Behavioral: Negative.        Objective:    Physical Exam Vitals and nursing note reviewed.  Constitutional:      General: He is not in acute distress.    Appearance: He is well-developed.  HENT:     Head: Normocephalic and atraumatic.  Eyes:     Conjunctiva/sclera: Conjunctivae normal.     Pupils: Pupils are equal, round, and reactive to light.  Cardiovascular:     Rate and Rhythm: Normal rate and regular rhythm.     Heart sounds: Normal heart sounds.  Pulmonary:     Effort: Pulmonary effort is normal. No respiratory distress.     Breath sounds: Normal breath sounds.  Skin:    General: Skin is warm and dry.  Psychiatric:        Behavior: Behavior normal.     BP 133/74   Pulse 95   Temp (!) 97.1 F (36.2 C) (Temporal)   Ht '5\' 9"'  (1.753 m)   Wt 264 lb 12.8 oz (120.1 kg)   SpO2 96%   BMI 39.10 kg/m  Wt Readings from Last 3 Encounters:  01/24/19 264 lb 12.8 oz (120.1 kg)  06/21/18 274 lb 9.6 oz (124.6 kg)  12/20/17 272 lb 12.8 oz (123.7 kg)    There  are no preventive care reminders to display for this patient.  There are no preventive care reminders to display for this patient.   Lab Results  Component Value Date   TSH 1.490 01/24/2019   Lab Results  Component Value Date   WBC 10.2 01/24/2019   HGB 13.9 01/24/2019   HCT 41.0 01/24/2019   MCV 95 01/24/2019   PLT 340 01/24/2019   Lab Results  Component Value Date   NA 135 01/24/2019   K 4.8 01/24/2019   CO2 20 01/24/2019  GLUCOSE 88 01/24/2019   BUN 11 01/24/2019   CREATININE 1.00 01/24/2019   BILITOT 0.3 01/24/2019   ALKPHOS 61 01/24/2019   AST 24 01/24/2019   ALT 13 01/24/2019   PROT 7.0 01/24/2019   ALBUMIN 3.9 01/24/2019   CALCIUM 9.0 01/24/2019   Lab Results  Component Value Date   CHOL 122 01/24/2019   Lab Results  Component Value Date   HDL 46 01/24/2019   Lab Results  Component Value Date   LDLCALC 59 01/24/2019   Lab Results  Component Value Date   TRIG 91 01/24/2019   Lab Results  Component Value Date   CHOLHDL 2.7 01/24/2019   Lab Results  Component Value Date   HGBA1C 5.2 11/23/2017       Assessment & Plan:   Problem List Items Addressed This Visit      Cardiovascular and Mediastinum   Essential hypertension - Primary   Relevant Orders   CBC with Differential/Platelet   CMP14+EGFR   Lipid Panel     Other   Mixed hyperlipidemia   Relevant Orders   CBC with Differential/Platelet   CMP14+EGFR   Lipid Panel    Other Visit Diagnoses    Well adult exam       Relevant Orders   CBC with Differential/Platelet   CMP14+EGFR   Lipid Panel   TSH   PSA       No orders of the defined types were placed in this encounter.    Terald Sleeper, PA-C

## 2019-03-12 ENCOUNTER — Other Ambulatory Visit: Payer: Self-pay

## 2019-03-13 ENCOUNTER — Ambulatory Visit (INDEPENDENT_AMBULATORY_CARE_PROVIDER_SITE_OTHER): Payer: BC Managed Care – PPO | Admitting: Physician Assistant

## 2019-03-13 ENCOUNTER — Encounter: Payer: Self-pay | Admitting: Physician Assistant

## 2019-03-13 VITALS — BP 161/81 | HR 82 | Temp 96.9°F | Ht 69.0 in | Wt 268.6 lb

## 2019-03-13 DIAGNOSIS — M542 Cervicalgia: Secondary | ICD-10-CM

## 2019-03-13 DIAGNOSIS — G8929 Other chronic pain: Secondary | ICD-10-CM | POA: Diagnosis not present

## 2019-03-13 DIAGNOSIS — M25512 Pain in left shoulder: Secondary | ICD-10-CM

## 2019-03-13 MED ORDER — METHYLPREDNISOLONE ACETATE 80 MG/ML IJ SUSP
80.0000 mg | Freq: Once | INTRAMUSCULAR | Status: AC
  Administered 2019-03-13: 80 mg via INTRAMUSCULAR

## 2019-03-13 MED ORDER — CYCLOBENZAPRINE HCL 10 MG PO TABS: 10.0000 mg | ORAL_TABLET | Freq: Three times a day (TID) | ORAL | 0 refills | Status: AC | PRN

## 2019-03-13 MED ORDER — MELOXICAM 7.5 MG PO TABS: 7.5000 mg | ORAL_TABLET | Freq: Every day | ORAL | 0 refills | Status: DC

## 2019-03-13 NOTE — Patient Instructions (Signed)
Shoulder Exercises Ask your health care provider which exercises are safe for you. Do exercises exactly as told by your health care provider and adjust them as directed. It is normal to feel mild stretching, pulling, tightness, or discomfort as you do these exercises. Stop right away if you feel sudden pain or your pain gets worse. Do not begin these exercises until told by your health care provider. Stretching exercises External rotation and abduction This exercise is sometimes called corner stretch. This exercise rotates your arm outward (external rotation) and moves your arm out from your body (abduction). 1. Stand in a doorway with one of your feet slightly in front of the other. This is called a staggered stance. If you cannot reach your forearms to the door frame, stand facing a corner of a room. 2. Choose one of the following positions as told by your health care provider: ? Place your hands and forearms on the door frame above your head. ? Place your hands and forearms on the door frame at the height of your head. ? Place your hands on the door frame at the height of your elbows. 3. Slowly move your weight onto your front foot until you feel a stretch across your chest and in the front of your shoulders. Keep your head and chest upright and keep your abdominal muscles tight. 4. Hold for __________ seconds. 5. To release the stretch, shift your weight to your back foot. Repeat __________ times. Complete this exercise __________ times a day. Extension, standing 1. Stand and hold a broomstick, a cane, or a similar object behind your back. ? Your hands should be a little wider than shoulder width apart. ? Your palms should face away from your back. 2. Keeping your elbows straight and your shoulder muscles relaxed, move the stick away from your body until you feel a stretch in your shoulders (extension). ? Avoid shrugging your shoulders while you move the stick. Keep your shoulder blades tucked  down toward the middle of your back. 3. Hold for __________ seconds. 4. Slowly return to the starting position. Repeat __________ times. Complete this exercise __________ times a day. Range-of-motion exercises Pendulum  1. Stand near a wall or a surface that you can hold onto for balance. 2. Bend at the waist and let your left / right arm hang straight down. Use your other arm to support you. Keep your back straight and do not lock your knees. 3. Relax your left / right arm and shoulder muscles, and move your hips and your trunk so your left / right arm swings freely. Your arm should swing because of the motion of your body, not because you are using your arm or shoulder muscles. 4. Keep moving your hips and trunk so your arm swings in the following directions, as told by your health care provider: ? Side to side. ? Forward and backward. ? In clockwise and counterclockwise circles. 5. Continue each motion for __________ seconds, or for as long as told by your health care provider. 6. Slowly return to the starting position. Repeat __________ times. Complete this exercise __________ times a day. Shoulder flexion, standing  1. Stand and hold a broomstick, a cane, or a similar object. Place your hands a little more than shoulder width apart on the object. Your left / right hand should be palm up, and your other hand should be palm down. 2. Keep your elbow straight and your shoulder muscles relaxed. Push the stick up with your healthy arm to   raise your left / right arm in front of your body, and then over your head until you feel a stretch in your shoulder (flexion). ? Avoid shrugging your shoulder while you raise your arm. Keep your shoulder blade tucked down toward the middle of your back. 3. Hold for __________ seconds. 4. Slowly return to the starting position. Repeat __________ times. Complete this exercise __________ times a day. Shoulder abduction, standing 1. Stand and hold a broomstick,  a cane, or a similar object. Place your hands a little more than shoulder width apart on the object. Your left / right hand should be palm up, and your other hand should be palm down. 2. Keep your elbow straight and your shoulder muscles relaxed. Push the object across your body toward your left / right side. Raise your left / right arm to the side of your body (abduction) until you feel a stretch in your shoulder. ? Do not raise your arm above shoulder height unless your health care provider tells you to do that. ? If directed, raise your arm over your head. ? Avoid shrugging your shoulder while you raise your arm. Keep your shoulder blade tucked down toward the middle of your back. 3. Hold for __________ seconds. 4. Slowly return to the starting position. Repeat __________ times. Complete this exercise __________ times a day. Internal rotation  1. Place your left / right hand behind your back, palm up. 2. Use your other hand to dangle an exercise band, a towel, or a similar object over your shoulder. Grasp the band with your left / right hand so you are holding on to both ends. 3. Gently pull up on the band until you feel a stretch in the front of your left / right shoulder. The movement of your arm toward the center of your body is called internal rotation. ? Avoid shrugging your shoulder while you raise your arm. Keep your shoulder blade tucked down toward the middle of your back. 4. Hold for __________ seconds. 5. Release the stretch by letting go of the band and lowering your hands. Repeat __________ times. Complete this exercise __________ times a day. Strengthening exercises External rotation  1. Sit in a stable chair without armrests. 2. Secure an exercise band to a stable object at elbow height on your left / right side. 3. Place a soft object, such as a folded towel or a small pillow, between your left / right upper arm and your body to move your elbow about 4 inches (10 cm) away  from your side. 4. Hold the end of the exercise band so it is tight and there is no slack. 5. Keeping your elbow pressed against the soft object, slowly move your forearm out, away from your abdomen (external rotation). Keep your body steady so only your forearm moves. 6. Hold for __________ seconds. 7. Slowly return to the starting position. Repeat __________ times. Complete this exercise __________ times a day. Shoulder abduction  1. Sit in a stable chair without armrests, or stand up. 2. Hold a __________ weight in your left / right hand, or hold an exercise band with both hands. 3. Start with your arms straight down and your left / right palm facing in, toward your body. 4. Slowly lift your left / right hand out to your side (abduction). Do not lift your hand above shoulder height unless your health care provider tells you that this is safe. ? Keep your arms straight. ? Avoid shrugging your shoulder while you   do this movement. Keep your shoulder blade tucked down toward the middle of your back. 5. Hold for __________ seconds. 6. Slowly lower your arm, and return to the starting position. Repeat __________ times. Complete this exercise __________ times a day. Shoulder extension 1. Sit in a stable chair without armrests, or stand up. 2. Secure an exercise band to a stable object in front of you so it is at shoulder height. 3. Hold one end of the exercise band in each hand. Your palms should face each other. 4. Straighten your elbows and lift your hands up to shoulder height. 5. Step back, away from the secured end of the exercise band, until the band is tight and there is no slack. 6. Squeeze your shoulder blades together as you pull your hands down to the sides of your thighs (extension). Stop when your hands are straight down by your sides. Do not let your hands go behind your body. 7. Hold for __________ seconds. 8. Slowly return to the starting position. Repeat __________ times.  Complete this exercise __________ times a day. Shoulder row 1. Sit in a stable chair without armrests, or stand up. 2. Secure an exercise band to a stable object in front of you so it is at waist height. 3. Hold one end of the exercise band in each hand. Position your palms so that your thumbs are facing the ceiling (neutral position). 4. Bend each of your elbows to a 90-degree angle (right angle) and keep your upper arms at your sides. 5. Step back until the band is tight and there is no slack. 6. Slowly pull your elbows back behind you. 7. Hold for __________ seconds. 8. Slowly return to the starting position. Repeat __________ times. Complete this exercise __________ times a day. Shoulder press-ups  1. Sit in a stable chair that has armrests. Sit upright, with your feet flat on the floor. 2. Put your hands on the armrests so your elbows are bent and your fingers are pointing forward. Your hands should be about even with the sides of your body. 3. Push down on the armrests and use your arms to lift yourself off the chair. Straighten your elbows and lift yourself up as much as you comfortably can. ? Move your shoulder blades down, and avoid letting your shoulders move up toward your ears. ? Keep your feet on the ground. As you get stronger, your feet should support less of your body weight as you lift yourself up. 4. Hold for __________ seconds. 5. Slowly lower yourself back into the chair. Repeat __________ times. Complete this exercise __________ times a day. Wall push-ups  1. Stand so you are facing a stable wall. Your feet should be about one arm-length away from the wall. 2. Lean forward and place your palms on the wall at shoulder height. 3. Keep your feet flat on the floor as you bend your elbows and lean forward toward the wall. 4. Hold for __________ seconds. 5. Straighten your elbows to push yourself back to the starting position. Repeat __________ times. Complete this exercise  __________ times a day. This information is not intended to replace advice given to you by your health care provider. Make sure you discuss any questions you have with your health care provider. Document Revised: 04/26/2018 Document Reviewed: 02/01/2018 [employer] Patient Education  2020 [employer]  

## 2019-03-14 ENCOUNTER — Other Ambulatory Visit (INDEPENDENT_AMBULATORY_CARE_PROVIDER_SITE_OTHER): Payer: BC Managed Care – PPO

## 2019-03-14 ENCOUNTER — Other Ambulatory Visit: Payer: Self-pay | Admitting: Physician Assistant

## 2019-03-14 ENCOUNTER — Other Ambulatory Visit: Payer: Self-pay

## 2019-03-14 DIAGNOSIS — I10 Essential (primary) hypertension: Secondary | ICD-10-CM

## 2019-03-14 DIAGNOSIS — M25512 Pain in left shoulder: Secondary | ICD-10-CM

## 2019-03-14 DIAGNOSIS — M542 Cervicalgia: Secondary | ICD-10-CM | POA: Diagnosis not present

## 2019-03-14 DIAGNOSIS — G8929 Other chronic pain: Secondary | ICD-10-CM

## 2019-03-14 DIAGNOSIS — M19012 Primary osteoarthritis, left shoulder: Secondary | ICD-10-CM | POA: Diagnosis not present

## 2019-03-16 ENCOUNTER — Encounter: Payer: Self-pay | Admitting: Physician Assistant

## 2019-03-16 NOTE — Progress Notes (Signed)
BP (!) 161/81   Pulse 82   Temp (!) 96.9 F (36.1 C) (Temporal)   Ht '5\' 9"'  (1.753 m)   Wt 268 lb 9.6 oz (121.8 kg)   SpO2 95%   BMI 39.67 kg/m    Subjective:    Patient ID: Joel Nurse., male    DOB: November 23, 1960, 59 y.o.   MRN: [id number]  HPI 1. Chronic left shoulder pain  2. Neck pain on left side   HPI: Joel Powell. is a 59 y.o. male presenting on 03/13/2019 for Shoulder Pain (left )  Over the past 3 months the patient has had increasing pain in his neck and shoulder.  The area does pinch down on the left side.  He has not been losing or dropping things.  He states that the range of motion has gone down.  He overall feels weaker when he is at work.  But again he still not dropping things.  He denies any specific injury.  He does feel like there is some popping and clicking sometimes in the shoulder.  Past Medical History:  Diagnosis Date  . Allergy   . Hepatitis A as child  . Hyperlipidemia   . Hypertension    Relevant past medical, surgical, family and social history reviewed and updated as indicated. Interim medical history since our last visit reviewed. Allergies and medications reviewed and updated. DATA REVIEWED: CHART IN EPIC  Family History reviewed for pertinent findings.  Review of Systems  Constitutional: Negative.  Negative for appetite change and fatigue.  Eyes: Negative for pain and visual disturbance.  Respiratory: Negative.  Negative for cough, chest tightness, shortness of breath and wheezing.   Cardiovascular: Negative.  Negative for chest pain, palpitations and leg swelling.  Gastrointestinal: Negative for abdominal pain, diarrhea and vomiting.  Genitourinary: Negative.   Musculoskeletal: Positive for arthralgias, joint swelling and neck pain.  Skin: Negative.  Negative for color change and rash.  Neurological: Negative.  Negative for weakness, numbness and headaches.  Psychiatric/Behavioral: Negative.     Allergies as of 03/13/2019    Reactions   Clonidine Derivatives    edema   Norvasc [amlodipine Besylate]    edema      Medication List       Accurate as of March 13, 2019 11:59 PM. If you have any questions, ask your nurse or doctor.        aspirin 81 MG tablet Take 81 mg by mouth daily.   cyclobenzaprine 10 MG tablet Commonly known as: FLEXERIL Take 1 tablet (10 mg total) by mouth 3 (three) times daily as needed for muscle spasms. Started by: Terald Sleeper, PA-C   doxazosin 4 MG tablet Commonly known as: CARDURA Take 1 tablet (4 mg total) by mouth daily.   fenofibrate micronized 134 MG capsule Commonly known as: LOFIBRA Take 1 capsule (134 mg total) by mouth daily before breakfast.   meloxicam 7.5 MG tablet Commonly known as: MOBIC Take 1 tablet (7.5 mg total) by mouth daily. Started by: Terald Sleeper, PA-C   metoprolol tartrate 50 MG tablet Commonly known as: LOPRESSOR Take 1 tablet (50 mg total) by mouth 2 (two) times daily.   multivitamin tablet Take 1 tablet by mouth daily.   sildenafil 20 MG tablet Commonly known as: REVATIO Take 1-5 tablets (20-100 mg total) by mouth daily.   simvastatin 20 MG tablet Commonly known as: ZOCOR Take 1 tablet (20 mg total) by mouth daily.   spironolactone 50 MG tablet Commonly  known as: ALDACTONE Take 1 tablet (50 mg total) by mouth daily.          Objective:    BP (!) 161/81   Pulse 82   Temp (!) 96.9 F (36.1 C) (Temporal)   Ht '5\' 9"'  (1.753 m)   Wt 268 lb 9.6 oz (121.8 kg)   SpO2 95%   BMI 39.67 kg/m   Allergies  Allergen Reactions  . Clonidine Derivatives     edema  . Norvasc [Amlodipine Besylate]     edema    Wt Readings from Last 3 Encounters:  03/13/19 268 lb 9.6 oz (121.8 kg)  01/24/19 264 lb 12.8 oz (120.1 kg)  06/21/18 274 lb 9.6 oz (124.6 kg)    Physical Exam Vitals and nursing note reviewed.  Constitutional:      General: He is not in acute distress.    Appearance: He is well-developed.  HENT:     Head:  Normocephalic and atraumatic.  Eyes:     Conjunctiva/sclera: Conjunctivae normal.     Pupils: Pupils are equal, round, and reactive to light.  Cardiovascular:     Rate and Rhythm: Normal rate and regular rhythm.     Heart sounds: Normal heart sounds.  Pulmonary:     Effort: Pulmonary effort is normal. No respiratory distress.     Breath sounds: Normal breath sounds.  Musculoskeletal:        General: Tenderness and deformity present. No swelling.     Left shoulder: Deformity present. Decreased range of motion.  Skin:    General: Skin is warm and dry.  Psychiatric:        Behavior: Behavior normal.     Results for orders placed or performed in visit on 01/24/19  PSA  Result Value Ref Range   Prostate Specific Ag, Serum 2.1 0.0 - 4.0 ng/mL  TSH  Result Value Ref Range   TSH 1.490 0.450 - 4.500 uIU/mL  Lipid Panel  Result Value Ref Range   Cholesterol, Total 122 100 - 199 mg/dL   Triglycerides 91 0 - 149 mg/dL   HDL 46 >39 mg/dL   VLDL Cholesterol Cal 17 5 - 40 mg/dL   LDL Chol Calc (NIH) 59 0 - 99 mg/dL   Chol/HDL Ratio 2.7 0.0 - 5.0 ratio  CMP14+EGFR  Result Value Ref Range   Glucose 88 65 - 99 mg/dL   BUN 11 8 - 27 mg/dL   Creatinine, Ser 1.00 0.76 - 1.27 mg/dL   GFR calc non Af Amer 81 >59 mL/min/1.73   GFR calc Af Amer 94 >59 mL/min/1.73   BUN/Creatinine Ratio 11 10 - 24   Sodium 135 134 - 144 mmol/L   Potassium 4.8 3.5 - 5.2 mmol/L   Chloride 100 96 - 106 mmol/L   CO2 20 20 - 29 mmol/L   Calcium 9.0 8.6 - 10.2 mg/dL   Total Protein 7.0 6.0 - 8.5 g/dL   Albumin 3.9 3.8 - 4.9 g/dL   Globulin, Total 3.1 1.5 - 4.5 g/dL   Albumin/Globulin Ratio 1.3 1.2 - 2.2   Bilirubin Total 0.3 0.0 - 1.2 mg/dL   Alkaline Phosphatase 61 39 - 117 IU/L   AST 24 0 - 40 IU/L   ALT 13 0 - 44 IU/L  CBC with Differential/Platelet  Result Value Ref Range   WBC 10.2 3.4 - 10.8 x10E3/uL   RBC 4.30 4.14 - 5.80 x10E6/uL   Hemoglobin 13.9 13.0 - 17.7 g/dL   Hematocrit 41.0 37.5 - 51.0 %  MCV 95 79 - 97 fL   MCH 32.3 26.6 - 33.0 pg   MCHC 33.9 31.5 - 35.7 g/dL   RDW 11.6 11.6 - 15.4 %   Platelets 340 150 - 450 x10E3/uL   Neutrophils 67 Not Estab. %   Lymphs 18 Not Estab. %   Monocytes 8 Not Estab. %   Eos 4 Not Estab. %   Basos 2 Not Estab. %   Neutrophils Absolute 7.0 1.4 - 7.0 x10E3/uL   Lymphocytes Absolute 1.8 0.7 - 3.1 x10E3/uL   Monocytes Absolute 0.8 0.1 - 0.9 x10E3/uL   EOS (ABSOLUTE) 0.4 0.0 - 0.4 x10E3/uL   Basophils Absolute 0.2 0.0 - 0.2 x10E3/uL   Immature Granulocytes 1 Not Estab. %   Immature Grans (Abs) 0.1 0.0 - 0.1 x10E3/uL      Assessment & Plan:   1. Chronic left shoulder pain - DG Cervical Spine Complete; Future - DG Shoulder Left; Future - meloxicam (MOBIC) 7.5 MG tablet; Take 1 tablet (7.5 mg total) by mouth daily.  Dispense: 30 tablet; Refill: 0 - cyclobenzaprine (FLEXERIL) 10 MG tablet; Take 1 tablet (10 mg total) by mouth 3 (three) times daily as needed for muscle spasms.  Dispense: 60 tablet; Refill: 0 - methylPREDNISolone acetate (DEPO-MEDROL) injection 80 mg  2. Neck pain on left side - DG Cervical Spine Complete; Future - DG Shoulder Left; Future - meloxicam (MOBIC) 7.5 MG tablet; Take 1 tablet (7.5 mg total) by mouth daily.  Dispense: 30 tablet; Refill: 0   Continue all other maintenance medications as listed above.  Follow up plan: Return in about 4 weeks (around 04/10/2019).  Educational handout given for shoulder exercises  Terald Sleeper PA-C [facility] [address]  [place], [place] [address] [phone]   03/16/2019, 9:26 PM

## 2019-03-18 ENCOUNTER — Telehealth: Payer: Self-pay | Admitting: Physician Assistant

## 2019-03-18 DIAGNOSIS — I10 Essential (primary) hypertension: Secondary | ICD-10-CM

## 2019-03-18 MED ORDER — SPIRONOLACTONE 50 MG PO TABS: 50.0000 mg | ORAL_TABLET | Freq: Every day | ORAL | 3 refills | Status: AC

## 2019-03-18 NOTE — Telephone Encounter (Signed)
Pt has not taken this medication in 4 days and has lost about 6 lbs

## 2019-03-18 NOTE — Telephone Encounter (Signed)
°  Medication Request  03/18/2019  What is the name of the medication? spironolactone (ALDACTONE) 50 MG tablet    Have you contacted your pharmacy to request a refill? yes  Which pharmacy would you like this sent to? [facility], it was denied he is completely out has appt with PCP on 07/28/2019   Patient notified that their request is being sent to the clinical staff for review and that they should receive a call once it is complete. If they do not receive a call within 24 hours they can check with their pharmacy or our office.

## 2019-03-18 NOTE — Telephone Encounter (Signed)
Refill sent.

## 2019-03-24 DIAGNOSIS — Z23 Encounter for immunization: Secondary | ICD-10-CM | POA: Diagnosis not present

## 2019-04-06 ENCOUNTER — Other Ambulatory Visit: Payer: Self-pay | Admitting: Physician Assistant

## 2019-04-06 DIAGNOSIS — G8929 Other chronic pain: Secondary | ICD-10-CM

## 2019-04-06 DIAGNOSIS — M542 Cervicalgia: Secondary | ICD-10-CM

## 2019-04-22 DIAGNOSIS — Z23 Encounter for immunization: Secondary | ICD-10-CM | POA: Diagnosis not present

## 2019-05-12 ENCOUNTER — Other Ambulatory Visit: Payer: Self-pay | Admitting: *Deleted

## 2019-05-12 DIAGNOSIS — G8929 Other chronic pain: Secondary | ICD-10-CM

## 2019-05-12 DIAGNOSIS — M542 Cervicalgia: Secondary | ICD-10-CM

## 2019-05-12 MED ORDER — MELOXICAM 7.5 MG PO TABS: 7.5000 mg | ORAL_TABLET | Freq: Every day | ORAL | 0 refills | Status: DC

## 2019-06-06 ENCOUNTER — Other Ambulatory Visit: Payer: Self-pay | Admitting: Family Medicine

## 2019-06-06 DIAGNOSIS — G8929 Other chronic pain: Secondary | ICD-10-CM

## 2019-06-06 DIAGNOSIS — M542 Cervicalgia: Secondary | ICD-10-CM

## 2019-06-06 DIAGNOSIS — M25512 Pain in left shoulder: Secondary | ICD-10-CM

## 2019-06-10 ENCOUNTER — Other Ambulatory Visit: Payer: Self-pay | Admitting: *Deleted

## 2019-06-10 DIAGNOSIS — I10 Essential (primary) hypertension: Secondary | ICD-10-CM

## 2019-06-10 MED ORDER — METOPROLOL TARTRATE 50 MG PO TABS: 50.0000 mg | ORAL_TABLET | Freq: Two times a day (BID) | ORAL | 0 refills | Status: DC

## 2019-07-04 ENCOUNTER — Other Ambulatory Visit: Payer: Self-pay | Admitting: Family Medicine

## 2019-07-04 DIAGNOSIS — M542 Cervicalgia: Secondary | ICD-10-CM

## 2019-07-04 DIAGNOSIS — G8929 Other chronic pain: Secondary | ICD-10-CM

## 2019-07-04 DIAGNOSIS — M25512 Pain in left shoulder: Secondary | ICD-10-CM

## 2019-07-09 DIAGNOSIS — E7800 Pure hypercholesterolemia, unspecified: Secondary | ICD-10-CM | POA: Diagnosis not present

## 2019-07-09 DIAGNOSIS — Z299 Encounter for prophylactic measures, unspecified: Secondary | ICD-10-CM | POA: Diagnosis not present

## 2019-07-09 DIAGNOSIS — J069 Acute upper respiratory infection, unspecified: Secondary | ICD-10-CM | POA: Diagnosis not present

## 2019-07-09 DIAGNOSIS — I10 Essential (primary) hypertension: Secondary | ICD-10-CM | POA: Diagnosis not present

## 2019-07-09 DIAGNOSIS — R5383 Other fatigue: Secondary | ICD-10-CM | POA: Diagnosis not present

## 2019-07-09 DIAGNOSIS — Z79899 Other long term (current) drug therapy: Secondary | ICD-10-CM | POA: Diagnosis not present

## 2019-07-09 DIAGNOSIS — R600 Localized edema: Secondary | ICD-10-CM | POA: Diagnosis not present

## 2019-07-09 DIAGNOSIS — Z6841 Body Mass Index (BMI) 40.0 and over, adult: Secondary | ICD-10-CM | POA: Diagnosis not present

## 2019-07-28 ENCOUNTER — Ambulatory Visit: Payer: BC Managed Care – PPO | Admitting: Physician Assistant

## 2019-07-28 ENCOUNTER — Ambulatory Visit: Payer: BC Managed Care – PPO | Admitting: Family Medicine

## 2019-07-28 ENCOUNTER — Encounter: Payer: Self-pay | Admitting: Physician Assistant

## 2019-07-29 ENCOUNTER — Other Ambulatory Visit: Payer: Self-pay | Admitting: Family Medicine

## 2019-07-29 DIAGNOSIS — M542 Cervicalgia: Secondary | ICD-10-CM

## 2019-07-29 DIAGNOSIS — G8929 Other chronic pain: Secondary | ICD-10-CM

## 2019-07-29 NOTE — Telephone Encounter (Signed)
[facility] NTBS 30 days given 07/04/19

## 2019-07-29 NOTE — Telephone Encounter (Signed)
Spoke with pt and he states he now goes to [facility] for his PCP.

## 2019-09-02 ENCOUNTER — Other Ambulatory Visit: Payer: Self-pay | Admitting: *Deleted

## 2019-09-02 DIAGNOSIS — I10 Essential (primary) hypertension: Secondary | ICD-10-CM

## 2019-09-02 MED ORDER — METOPROLOL TARTRATE 50 MG PO TABS
50.0000 mg | ORAL_TABLET | Freq: Two times a day (BID) | ORAL | 0 refills | Status: AC
Start: 2019-09-02 — End: ?

## 2019-11-05 DIAGNOSIS — Z1331 Encounter for screening for depression: Secondary | ICD-10-CM | POA: Diagnosis not present

## 2019-11-05 DIAGNOSIS — E7800 Pure hypercholesterolemia, unspecified: Secondary | ICD-10-CM | POA: Diagnosis not present

## 2019-11-05 DIAGNOSIS — Z79899 Other long term (current) drug therapy: Secondary | ICD-10-CM | POA: Diagnosis not present

## 2019-11-05 DIAGNOSIS — Z6841 Body Mass Index (BMI) 40.0 and over, adult: Secondary | ICD-10-CM | POA: Diagnosis not present

## 2019-11-05 DIAGNOSIS — I10 Essential (primary) hypertension: Secondary | ICD-10-CM | POA: Diagnosis not present

## 2019-11-05 DIAGNOSIS — Z299 Encounter for prophylactic measures, unspecified: Secondary | ICD-10-CM | POA: Diagnosis not present

## 2019-11-05 DIAGNOSIS — Z0000 Encounter for general adult medical examination without abnormal findings: Secondary | ICD-10-CM | POA: Diagnosis not present

## 2019-11-05 DIAGNOSIS — R5383 Other fatigue: Secondary | ICD-10-CM | POA: Diagnosis not present

## 2019-11-05 DIAGNOSIS — Z125 Encounter for screening for malignant neoplasm of prostate: Secondary | ICD-10-CM | POA: Diagnosis not present

## 2019-11-13 DIAGNOSIS — L859 Epidermal thickening, unspecified: Secondary | ICD-10-CM | POA: Diagnosis not present

## 2019-11-13 DIAGNOSIS — I10 Essential (primary) hypertension: Secondary | ICD-10-CM | POA: Diagnosis not present

## 2019-11-13 DIAGNOSIS — Z299 Encounter for prophylactic measures, unspecified: Secondary | ICD-10-CM | POA: Diagnosis not present

## 2019-11-13 DIAGNOSIS — D485 Neoplasm of uncertain behavior of skin: Secondary | ICD-10-CM | POA: Diagnosis not present

## 2019-11-13 DIAGNOSIS — L820 Inflamed seborrheic keratosis: Secondary | ICD-10-CM | POA: Diagnosis not present

## 2020-05-04 DIAGNOSIS — Z299 Encounter for prophylactic measures, unspecified: Secondary | ICD-10-CM | POA: Diagnosis not present

## 2020-05-04 DIAGNOSIS — I10 Essential (primary) hypertension: Secondary | ICD-10-CM | POA: Diagnosis not present

## 2020-06-28 ENCOUNTER — Other Ambulatory Visit: Payer: Self-pay | Admitting: *Deleted

## 2020-06-28 DIAGNOSIS — I10 Essential (primary) hypertension: Secondary | ICD-10-CM

## 2020-07-01 ENCOUNTER — Other Ambulatory Visit: Payer: Self-pay | Admitting: *Deleted

## 2020-07-01 DIAGNOSIS — I10 Essential (primary) hypertension: Secondary | ICD-10-CM

## 2020-11-19 DIAGNOSIS — I10 Essential (primary) hypertension: Secondary | ICD-10-CM | POA: Diagnosis not present

## 2020-11-19 DIAGNOSIS — Z1331 Encounter for screening for depression: Secondary | ICD-10-CM | POA: Diagnosis not present

## 2020-11-19 DIAGNOSIS — Z6841 Body Mass Index (BMI) 40.0 and over, adult: Secondary | ICD-10-CM | POA: Diagnosis not present

## 2020-11-19 DIAGNOSIS — R5383 Other fatigue: Secondary | ICD-10-CM | POA: Diagnosis not present

## 2020-11-19 DIAGNOSIS — Z0000 Encounter for general adult medical examination without abnormal findings: Secondary | ICD-10-CM | POA: Diagnosis not present

## 2020-11-19 DIAGNOSIS — Z87891 Personal history of nicotine dependence: Secondary | ICD-10-CM | POA: Diagnosis not present

## 2020-11-19 DIAGNOSIS — Z299 Encounter for prophylactic measures, unspecified: Secondary | ICD-10-CM | POA: Diagnosis not present

## 2020-11-19 DIAGNOSIS — E7800 Pure hypercholesterolemia, unspecified: Secondary | ICD-10-CM | POA: Diagnosis not present

## 2020-11-24 DIAGNOSIS — Z125 Encounter for screening for malignant neoplasm of prostate: Secondary | ICD-10-CM | POA: Diagnosis not present

## 2020-11-24 DIAGNOSIS — Z79899 Other long term (current) drug therapy: Secondary | ICD-10-CM | POA: Diagnosis not present

## 2020-11-24 DIAGNOSIS — E7800 Pure hypercholesterolemia, unspecified: Secondary | ICD-10-CM | POA: Diagnosis not present

## 2020-11-24 DIAGNOSIS — R5383 Other fatigue: Secondary | ICD-10-CM | POA: Diagnosis not present

## 2020-12-15 DIAGNOSIS — I10 Essential (primary) hypertension: Secondary | ICD-10-CM | POA: Diagnosis not present

## 2020-12-15 DIAGNOSIS — Z1211 Encounter for screening for malignant neoplasm of colon: Secondary | ICD-10-CM | POA: Diagnosis not present

## 2020-12-15 DIAGNOSIS — E785 Hyperlipidemia, unspecified: Secondary | ICD-10-CM | POA: Diagnosis not present

## 2020-12-25 DIAGNOSIS — Z20822 Contact with and (suspected) exposure to covid-19: Secondary | ICD-10-CM | POA: Diagnosis not present

## 2020-12-25 DIAGNOSIS — J101 Influenza due to other identified influenza virus with other respiratory manifestations: Secondary | ICD-10-CM | POA: Diagnosis not present

## 2020-12-25 DIAGNOSIS — M791 Myalgia, unspecified site: Secondary | ICD-10-CM | POA: Diagnosis not present

## 2020-12-25 DIAGNOSIS — J208 Acute bronchitis due to other specified organisms: Secondary | ICD-10-CM | POA: Diagnosis not present

## 2021-07-10 IMAGING — DX DG CERVICAL SPINE COMPLETE 4+V
5 series · 5 of 5 positions shown · non-contrast
Comparison: None.

CLINICAL DATA: Neck pain and left arm and shoulder pain and
weakness.

EXAM:
CERVICAL SPINE - COMPLETE 4+ VIEW

[c-spine lat]
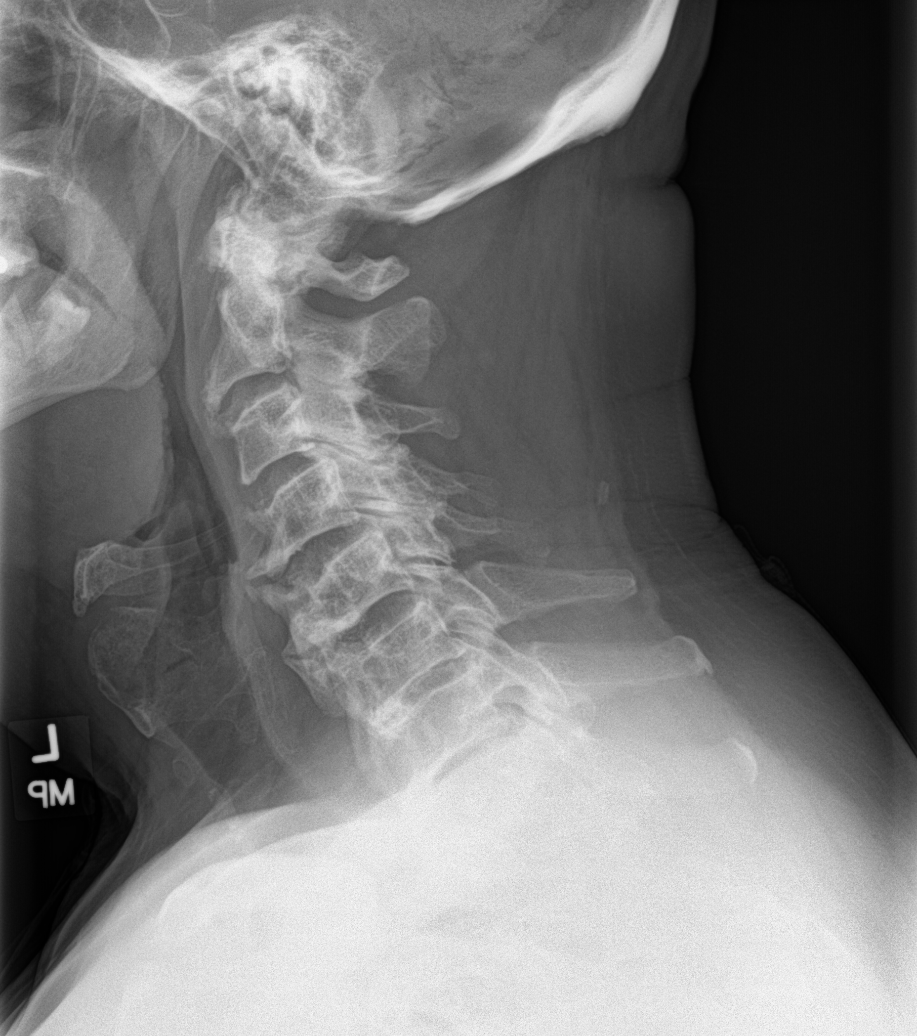

[c-spine obl (1 of 2)]
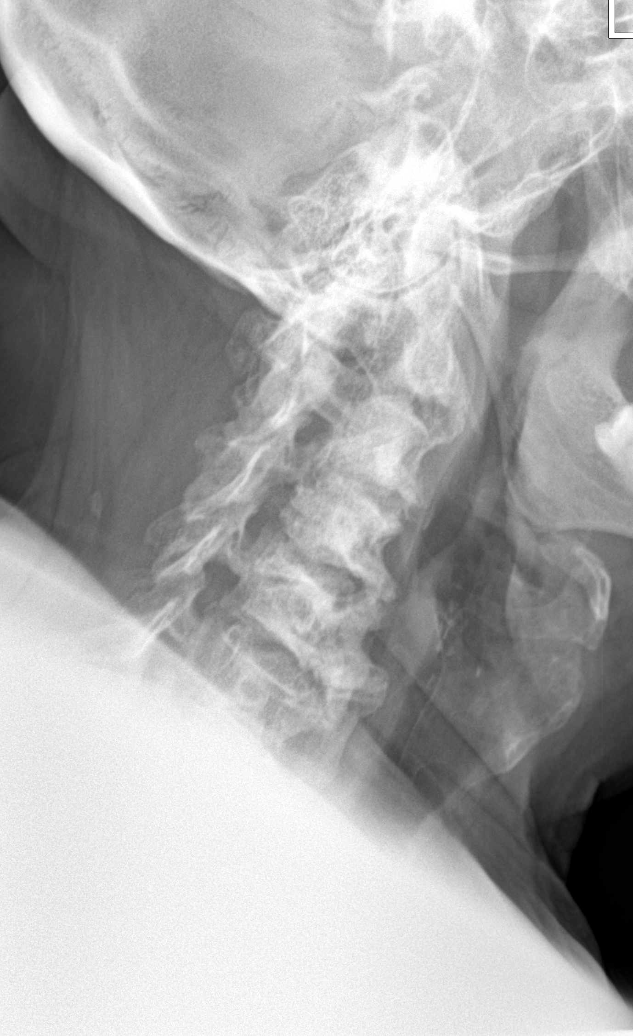

[c-spine obl (2 of 2)]
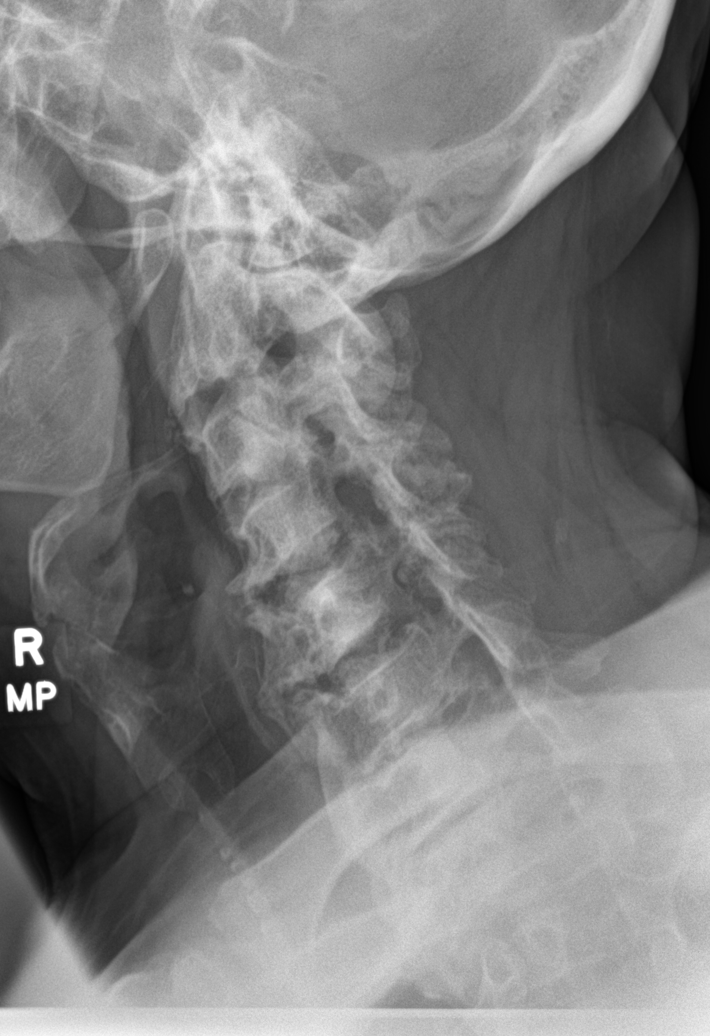

[c-spine ap]
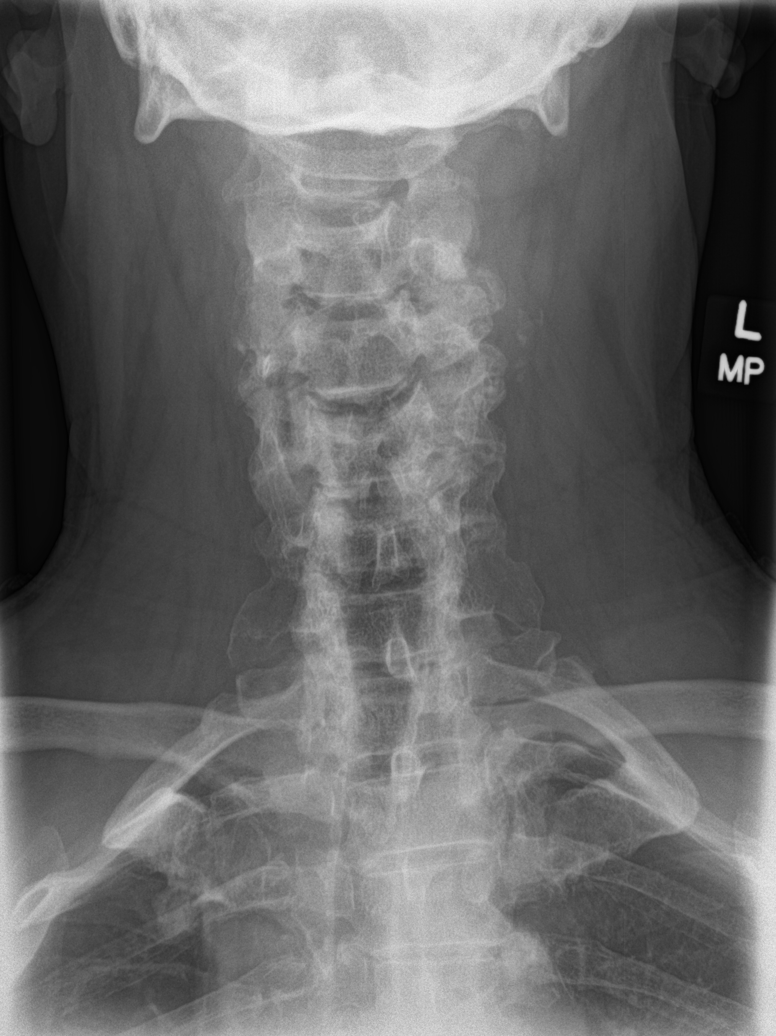

[c-spine open mouth]
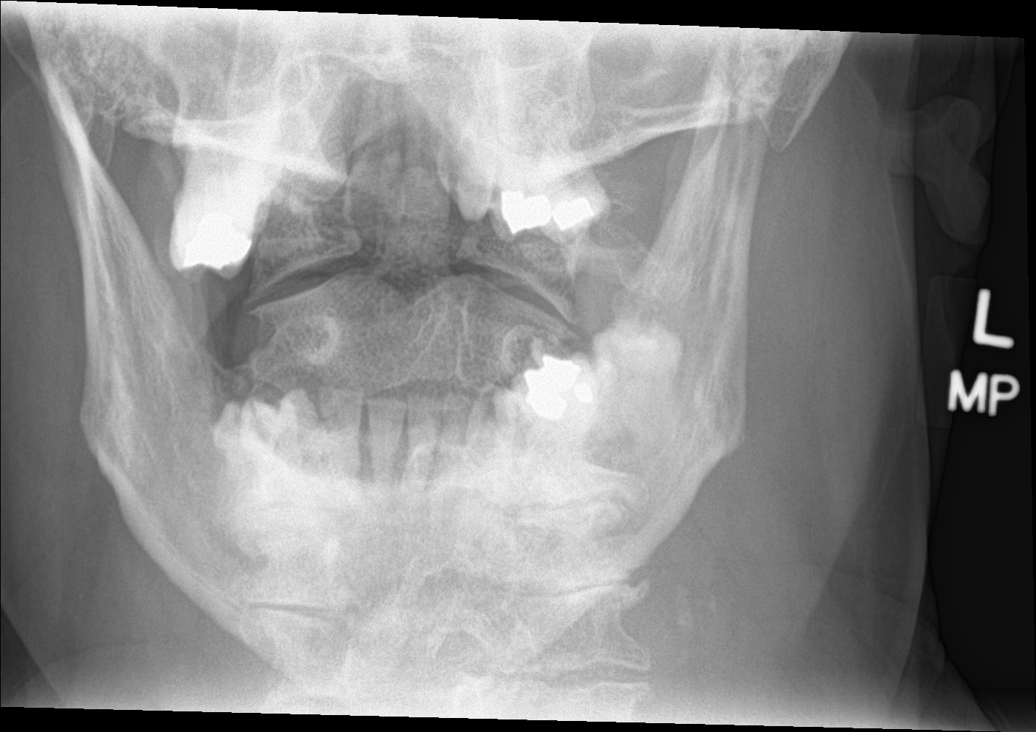

[5 of 5 positions shown; findings below may reference images not displayed]

FINDINGS: There is slight anterolisthesis of C3 on C4 and of C4 on C5.
Anterior osteophytes fuse the C6-7 level. There is severe facet
arthritis from C2-3 through C4-5 bilaterally. Left foraminal
narrowing at C3-4 and C5-6.

Prevertebral soft tissues are normal.
IMPRESSION: 1. Multilevel severe facet arthritis in the cervical spine with left
foraminal narrowing at C3-4 and C5-6.
2. Slight anterolisthesis at C3-4 and C4-5.

## 2021-07-14 ENCOUNTER — Encounter: Payer: Self-pay | Admitting: Gastroenterology
